# Patient Record
Sex: Male | Born: 1965 | Race: White | Hispanic: No | Marital: Married | Smoking: Never smoker
Health system: Southern US, Community
[De-identification: ages and names within clinical notes are randomized; demographics above are authoritative.]

## PROBLEM LIST (undated history)

## (undated) DIAGNOSIS — I10 Essential (primary) hypertension: Secondary | ICD-10-CM

## (undated) DIAGNOSIS — Z87442 Personal history of urinary calculi: Secondary | ICD-10-CM

## (undated) DIAGNOSIS — M7662 Achilles tendinitis, left leg: Secondary | ICD-10-CM

## (undated) DIAGNOSIS — M5137 Other intervertebral disc degeneration, lumbosacral region: Secondary | ICD-10-CM

## (undated) DIAGNOSIS — E785 Hyperlipidemia, unspecified: Secondary | ICD-10-CM

## (undated) HISTORY — PX: ROTATOR CUFF REPAIR: SHX139

## (undated) HISTORY — DX: Morbid (severe) obesity due to excess calories: E66.01

## (undated) HISTORY — DX: Essential (primary) hypertension: I10

## (undated) HISTORY — DX: Other intervertebral disc degeneration, lumbosacral region: M51.37

## (undated) HISTORY — DX: Hyperlipidemia, unspecified: E78.5

## (undated) HISTORY — DX: Achilles tendinitis, left leg: M76.62

## (undated) HISTORY — DX: Personal history of urinary calculi: Z87.442

## (undated) HISTORY — PX: TOE SURGERY: SHX1073

---

## 1999-02-14 ENCOUNTER — Encounter: Payer: Self-pay | Admitting: Emergency Medicine

## 1999-02-14 ENCOUNTER — Emergency Department (HOSPITAL_COMMUNITY): Admission: EM | Admit: 1999-02-14 | Discharge: 1999-02-14 | Payer: Self-pay | Admitting: Emergency Medicine

## 2007-02-03 ENCOUNTER — Emergency Department (HOSPITAL_COMMUNITY): Admission: EM | Admit: 2007-02-03 | Discharge: 2007-02-03 | Payer: Self-pay | Admitting: Emergency Medicine

## 2007-12-29 ENCOUNTER — Inpatient Hospital Stay (HOSPITAL_COMMUNITY): Admission: EM | Admit: 2007-12-29 | Discharge: 2008-01-12 | Payer: Self-pay | Admitting: Emergency Medicine

## 2007-12-30 ENCOUNTER — Encounter (INDEPENDENT_AMBULATORY_CARE_PROVIDER_SITE_OTHER): Payer: Self-pay | Admitting: General Surgery

## 2007-12-30 ENCOUNTER — Ambulatory Visit: Payer: Self-pay | Admitting: Internal Medicine

## 2008-01-12 ENCOUNTER — Ambulatory Visit: Payer: Self-pay | Admitting: Gastroenterology

## 2008-07-16 ENCOUNTER — Encounter: Payer: Self-pay | Admitting: Internal Medicine

## 2008-12-06 LAB — CONVERTED CEMR LAB: PSA: 1.2 ng/mL

## 2008-12-13 ENCOUNTER — Ambulatory Visit: Payer: Self-pay | Admitting: Internal Medicine

## 2008-12-13 DIAGNOSIS — E785 Hyperlipidemia, unspecified: Secondary | ICD-10-CM

## 2008-12-13 DIAGNOSIS — Z87442 Personal history of urinary calculi: Secondary | ICD-10-CM | POA: Insufficient documentation

## 2008-12-13 DIAGNOSIS — M51379 Other intervertebral disc degeneration, lumbosacral region without mention of lumbar back pain or lower extremity pain: Secondary | ICD-10-CM | POA: Insufficient documentation

## 2008-12-13 DIAGNOSIS — I10 Essential (primary) hypertension: Secondary | ICD-10-CM

## 2008-12-13 DIAGNOSIS — E782 Mixed hyperlipidemia: Secondary | ICD-10-CM | POA: Insufficient documentation

## 2008-12-13 DIAGNOSIS — M5137 Other intervertebral disc degeneration, lumbosacral region: Secondary | ICD-10-CM

## 2008-12-13 HISTORY — DX: Essential (primary) hypertension: I10

## 2008-12-13 HISTORY — DX: Other intervertebral disc degeneration, lumbosacral region: M51.37

## 2008-12-13 HISTORY — DX: Other intervertebral disc degeneration, lumbosacral region without mention of lumbar back pain or lower extremity pain: M51.379

## 2008-12-13 HISTORY — DX: Hyperlipidemia, unspecified: E78.5

## 2008-12-13 HISTORY — DX: Personal history of urinary calculi: Z87.442

## 2009-02-15 ENCOUNTER — Telehealth (INDEPENDENT_AMBULATORY_CARE_PROVIDER_SITE_OTHER): Payer: Self-pay | Admitting: *Deleted

## 2009-08-08 ENCOUNTER — Telehealth: Payer: Self-pay | Admitting: Internal Medicine

## 2010-09-01 ENCOUNTER — Ambulatory Visit: Payer: Self-pay | Admitting: Internal Medicine

## 2010-09-01 ENCOUNTER — Encounter: Payer: Self-pay | Admitting: Internal Medicine

## 2010-09-01 LAB — CONVERTED CEMR LAB
Albumin: 4.4 g/dL (ref 3.5–5.2)
Alkaline Phosphatase: 39 units/L (ref 39–117)
Basophils Absolute: 0.1 10*3/uL (ref 0.0–0.1)
Basophils Relative: 1 % (ref 0.0–3.0)
CO2: 29 meq/L (ref 19–32)
Calcium: 9.3 mg/dL (ref 8.4–10.5)
Chloride: 105 meq/L (ref 96–112)
Cholesterol, target level: 200 mg/dL
Cholesterol: 180 mg/dL (ref 0–200)
Eosinophils Absolute: 0.4 10*3/uL (ref 0.0–0.7)
Glucose, Bld: 86 mg/dL (ref 70–99)
HCT: 48.3 % (ref 39.0–52.0)
HDL: 32.8 mg/dL — ABNORMAL LOW (ref >39.00)
Hemoglobin: 16.7 g/dL (ref 13.0–17.0)
Leukocytes, UA: NEGATIVE
Lymphocytes Relative: 22.7 % (ref 12.0–46.0)
Lymphs Abs: 1.5 10*3/uL (ref 0.7–4.0)
MCHC: 34.5 g/dL (ref 30.0–36.0)
MCV: 90.3 fL (ref 78.0–100.0)
Neutro Abs: 4 10*3/uL (ref 1.4–7.7)
PSA: 1.46 ng/mL (ref 0.10–4.00)
Potassium: 5.2 meq/L — ABNORMAL HIGH (ref 3.5–5.1)
RBC: 5.35 M/uL (ref 4.22–5.81)
RDW: 12.6 % (ref 11.5–14.6)
Sodium: 141 meq/L (ref 135–145)
Specific Gravity, Urine: 1.025 (ref 1.000–1.030)
TSH: 1.36 microintl units/mL (ref 0.35–5.50)
Total Protein: 7.5 g/dL (ref 6.0–8.3)
Urine Glucose: NEGATIVE mg/dL
Urobilinogen, UA: 0.2 (ref 0.0–1.0)
VLDL: 31.6 mg/dL (ref 0.0–40.0)
pH: 7 (ref 5.0–8.0)

## 2010-10-02 ENCOUNTER — Telehealth: Payer: Self-pay | Admitting: Internal Medicine

## 2010-10-19 ENCOUNTER — Telehealth: Payer: Self-pay | Admitting: Internal Medicine

## 2010-12-05 NOTE — Assessment & Plan Note (Signed)
Summary: CPX/ WANTS TO DO LABS SAME DAY/ REFILLS / NWS  #   Vital Signs:  Patient profile:   45 year old male Height:      73 inches Weight:      290 pounds BMI:     38.40 O2 Sat:      93 % on Room air Temp:     97.8 degrees F oral Pulse rate:   64 / minute BP sitting:   120 / 78  Vitals Entered By: Corwin Levins MD (September 01, 2010 9:04 AM)  O2 Flow:  Room air  Preventive Care Screening  PSA:    Date:  12/06/2008    Results:  1.20  ng/mL   History of Present Illness: here for wellness; peak wt has been as much as 325 at home, but started walking 2.5 miles daily without missing for several months with good wt loss to current;  Pt denies CP, worsening sob, doe, wheezing, orthopnea, pnd, worsening LE edema, palps, dizziness or syncope  Pt denies new neuro symptoms such as headache, facial or extremity weakness  Pt denies polydipsia, polyuria.  Overall good compliance with meds, trying to follow low chol diet, wt and excercise as above,  Declines flu shot today.  Pt states good ability with ADL's, low fall risk, home safety reviewed and adequate, no significant change in hearing or vision.  Denies worsening depressive symptoms, suicidal ideation, or panic.  No new complaints   Lipid Management History:      Positive NCEP/ATP III risk factors include male age 56 years old or older and hypertension.  Negative NCEP/ATP III risk factors include non-tobacco-user status.    Preventive Screening-Counseling & Management      Drug Use:  no.    Problems Prior to Update: 1)  Preventive Health Care  (ICD-V70.0) 2)  Disc Disease, Lumbar  (ICD-722.52) 3)  Hyperlipidemia  (ICD-272.4) 4)  Nephrolithiasis, Hx of  (ICD-V13.01) 5)  Hypertension  (ICD-401.9)  Medications Prior to Update: 1)  Lisinopril 10 Mg Tabs (Lisinopril) .... Once Daily 2)  Adult Aspirin Ec Low Strength 81 Mg Tbec (Aspirin) .Marland Kitchen.. 1 By Mouth Once Daily 3)  Acyclovir 200 Mg Caps (Acyclovir) .Marland Kitchen.. 1 By Mouth 5 Times Per Day 4)   Valtrex 1 Gm Tabs (Valacyclovir Hcl) .Marland Kitchen.. 1 Gm By Mouth Two Times A Day As Needed  Current Medications (verified): 1)  Lisinopril 10 Mg Tabs (Lisinopril) .Marland Kitchen.. 1 By Mouth Once Daily 2)  Adult Aspirin Ec Low Strength 81 Mg Tbec (Aspirin) .Marland Kitchen.. 1 By Mouth Once Daily 3)  Valtrex 1 Gm Tabs (Valacyclovir Hcl) .Marland Kitchen.. 1 Gm By Mouth Two Times A Day As Needed  Allergies (verified): No Known Drug Allergies  Past History:  Past Medical History: Last updated: 12/13/2008 Hypertension Nephrolithiasis, hx of Hyperlipidemia 2/09 episode abd wall cellulitis and renal insufficiency recurrent ear problems/eustachian valve dysfunction hx of lumbar disc disease  recurrent fever blisters  Past Surgical History: Last updated: 12/13/2008 Rotator cuff repair - dr Priscille Kluver   Family History: Last updated: 12/13/2008 mother with DJD father with elev chol, heart disease, HTN, stroke, DM grandmother with breast cancer grandfather with "pre-leukemia" blood disorder, HTN mutliple others with HTN  Social History: Last updated: 09/01/2010 Married 1 child work - Production designer, 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         all otherwise  negative per pt -  but would like name for urology for vasectomy so he can self refer  Physical Exam  General:  alert and overweight-appearing.   Head:  Normocephalic and atraumatic without obvious abnormalities. No apparent alopecia or balding. Eyes:  No corneal or conjunctival inflammation noted. EOMI. Perrla. Ears:  External ear exam shows no significant lesions or deformities.  Otoscopic examination reveals clear canals, tympanic membranes are intact bilaterally without bulging, retraction, inflammation or discharge. Hearing is grossly normal bilaterally. Nose:  External nasal examination shows no deformity or inflammation. Nasal mucosa are pink and moist without lesions or exudates. Mouth:  Oral mucosa and oropharynx without lesions or exudates.  Teeth in good repair. Neck:  No deformities, masses, or tenderness noted. Lungs:  Normal respiratory effort, chest expands symmetrically. Lungs are clear to auscultation, no crackles or wheezes. Heart:  Normal rate and regular rhythm. S1 and S2 normal without gallop, murmur, click, rub or other extra sounds. Abdomen:  Bowel sounds positive,abdomen soft and non-tender without masses, organomegaly or hernias noted. Msk:  no joint tenderness and no joint swelling.   Extremities:  no edema, no ulcers  Neurologic:  cranial nerves II-XII intact and strength normal in all extremities.   Skin:  color normal and no rashes.   Psych:  not depressed appearing and slightly anxious.     Impression & Recommendations:  Problem # 1:  Preventive Health Care (ICD-V70.0) Overall doing well, age appropriate education and counseling updated, referral for preventive services and immunizations addressed, dietary counseling and smoking status adressed , most recent labs reviewed, ecg reviewed I have personally reviewed and have noted 1.The patient's medical and social history 2.Their use of alcohol, tobacco or illicit drugs 3.Their current medications and  supplements 4. Functional ability including ADL's, fall risk, home safety risk, hearing & visual impairment 5.Diet and physical activities 6.Evidence for depression or mood disorders The patients weight, height, BMI  have been recorded in the chart I have made referrals, counseling and provided education to the patient based review of the above   Orders: EKG w/ Interpretation (93000) TLB-BMP (Basic Metabolic Panel-BMET) (80048-METABOL) TLB-CBC Platelet - w/Differential (85025-CBCD) TLB-Hepatic/Liver Function Pnl (80076-HEPATIC) TLB-Lipid Panel (80061-LIPID) TLB-PSA (Prostate Specific Antigen) (84153-PSA) TLB-TSH (Thyroid Stimulating Hormone) (84443-TSH) TLB-Udip ONLY (81003-UDIP)  Problem # 2:  HYPERTENSION (ICD-401.9)  His updated medication list for this problem includes:    Lisinopril 10 Mg Tabs (Lisinopril) .Marland Kitchen... 1 by mouth once daily  BP today: 120/78 Prior BP: 142/90 (12/13/2008) stable overall by hx and exam, ok to continue meds/tx as is   Complete Medication List: 1)  Lisinopril 10 Mg Tabs (Lisinopril) .Marland Kitchen.. 1 by mouth once daily 2)  Adult Aspirin Ec Low Strength 81 Mg Tbec (Aspirin) .Marland Kitchen.. 1 by mouth once daily 3)  Valtrex 1 Gm Tabs (Valacyclovir hcl) .Marland Kitchen.. 1 gm by mouth two times a day as needed  Lipid Assessment/Plan:      Based on NCEP/ATP III, the patient's risk factor category is "2 or more risk factors and  a calculated 10 year CAD risk of > 20%".  The patient's lipid goals are as follows: Total cholesterol goal is 200; LDL cholesterol goal is 130; HDL cholesterol goal is 40; Triglyceride goal is 150.     Patient Instructions: 1)  consider Dr Dorann Ou urology for the vasectomy 2)  Please go to the Lab in the basement for your blood and/or urine tests today 3)  Please call the number on the [facility] Card for results of your testing 4)  Continue all previous medications as before this visit  5)  Please schedule a follow-up appointment in 1 year, or sooner if  needed Prescriptions: VALTREX 1 GM TABS (VALACYCLOVIR HCL) 1 gm by mouth two times a day as needed  #60 x 5   Entered and Authorized by:   Corwin Levins MD   Signed by:   Corwin Levins MD on 09/01/2010   Method used:   Print then Give to Patient   RxID:   [id number] LISINOPRIL 10 MG TABS (LISINOPRIL) 1 by mouth once daily  #90 x 3   Entered and Authorized by:   Corwin Levins MD   Signed by:   Corwin Levins MD on 09/01/2010   Method used:   Electronically to        [facility]* (mail-order)             , [place]         Ph: [phone]       Fax: [phone]   RxID:   [id number]    Orders Added: 1)  EKG w/ Interpretation [93000] 2)  TLB-BMP (Basic Metabolic Panel-BMET) [80048-METABOL] 3)  TLB-CBC Platelet - w/Differential [85025-CBCD] 4)  TLB-Hepatic/Liver Function Pnl [80076-HEPATIC] 5)  TLB-Lipid Panel [80061-LIPID] 6)  TLB-PSA (Prostate Specific Antigen) [84153-PSA] 7)  TLB-TSH (Thyroid Stimulating Hormone) [84443-TSH] 8)  TLB-Udip ONLY [81003-UDIP] 9)  Est. Patient 40-64 years [62831]

## 2010-12-05 NOTE — Progress Notes (Signed)
Summary: REFILL   Phone Note Refill Request Call back at Work Phone [phone] Call back at [phone]   Refills Requested: Medication #1:  LISINOPRIL 10 MG TABS 1 by mouth once daily TO GO TO [facility] - Pt req a call when complete  Initial call taken by: Lamar Sprinkles, CMA,  October 02, 2010 5:29 PM  Follow-up for Phone Call        Pt informed  Follow-up by: Lamar Sprinkles, CMA,  October 02, 2010 6:41 PM    Prescriptions: LISINOPRIL 10 MG TABS (LISINOPRIL) 1 by mouth once daily  #90 x 3   Entered by:   Lamar Sprinkles, CMA   Authorized by:   Corwin Levins MD   Signed by:   Lamar Sprinkles, CMA on 10/02/2010   Method used:   Faxed to ...       [facility]* (mail-order)             , [place]         Ph: [phone]       Fax: [phone]   RxID:   [id number]

## 2010-12-07 NOTE — Progress Notes (Signed)
Summary: Back pain  Phone Note Call from Patient Call back at Home Phone [phone]   Caller: Patient----[phone] Summary of Call: Pt requesting muscle relaxers for back pain. Pt has had back pain for a couple days. Pt has no known injury. Initial call taken by: Verdell Face,  October 19, 2010 4:11 PM  Follow-up for Phone Call        done per emr this time only, to f/u OV for persistent pain, fever, wt loss, bowel or bladder change, or extremity symtpom such as pain/weakness/numbness Follow-up by: Corwin Levins MD,  October 19, 2010 8:52 PM  Additional Follow-up for Phone Call Additional follow up Details #1::        Pt advised in detail of above Additional Follow-up by: Margaret Pyle, CMA,  October 20, 2010 9:48 AM    New/Updated Medications: FLEXERIL 5 MG TABS (CYCLOBENZAPRINE HCL) 1 by mouth three times a day as needed pain Prescriptions: FLEXERIL 5 MG TABS (CYCLOBENZAPRINE HCL) 1 by mouth three times a day as needed pain  #40 x 0   Entered and Authorized by:   Corwin Levins MD   Signed by:   Corwin Levins MD on 10/19/2010   Method used:   Electronically to        [facility]  [address] [phone]* (retail)       [address] [place], [place]  [address]       Ph: [phone] or [phone]       Fax: [phone]   RxID:   [id number]

## 2011-03-20 NOTE — H&P (Signed)
NAME:  Benjamin Paul, Benjamin Paul NO.:  [id number]   MEDICAL RECORD NO.:  [id number]          PATIENT TYPE:  EMS   LOCATION:  ED                           FACILITY:  [facility]   PHYSICIAN:  Angelia Mould. Derrell Lolling, M.D.DATE OF BIRTH:  1966-09-16   DATE OF ADMISSION:  12/29/2007  DATE OF DISCHARGE:                              HISTORY & PHYSICAL   CHIEF COMPLAINT:  Abdominal pain and shingles.   PRESENT ILLNESS:  This is a pleasant but acutely ill 45 year old white  man who states that he has been sick for 7 days.  Monday, 7 days ago, he  noted the onset of fever and chills with temperature up to 102.  He has  been nauseated since that time and has not vomited.  He is continued to  have daily bowel movements.  His bowel movements have gotten a little  softer but he has not seen any blood in them.   His symptoms continued all week.  He finally went to [place] at [facility]  on Thursday, February 19.  He was told that he might have shingles and  was given a prescription for an antiviral, which he cannot remember.  He  continues to have abdominal pain, more so on the right lower quadrant.  He has noted some pain in his upper thigh on the right side.  His last  bowel movement was today at 10 o'clock a.m. and it was soft with no  blood in it.   He was seen by Dr. Rubin Payor in the emergency room.  Dr. Rubin Payor noted  his abdominal pain and thought that he might have an incarcerated  hernia.  Abdominal x-rays have been performed which show a question of  atelectasis of the left base with a possible calcified granuloma.  Bowel  gas pattern suggested a partial proximal small-bowel obstruction  although there is plenty of gas in the right colon and some gas in the  transverse colon and splenic flexure as well, so it is not clear whether  this is an obstruction or ileus.  The patient was found to have a white  blood cell count of 24,000, hemoglobin of 13.9, BUN of 35, a creatinine  2.76, a  sodium of 131 and potassium of 3.5 and glucose of 127.  This was  all felt to be acute.  I was called to see the patient.   PAST HISTORY:  The patient has had surgery on his left shoulder but has  otherwise been healthy.  He has borderline hypertension.   CURRENT MEDICATIONS:  Lisinopril 10 mg a day.   DRUG ALLERGIES:  None known.   SOCIAL HISTORY:  The patient is married, has one child.  They live in  [place].  He a Production designer, theatre/television/film for [employer].  Denies tobacco.  Uses  alcohol rarely.   FAMILY HISTORY:  Mother and father are with him.  Mother living and  well.  Father living, has had a myocardial infarction, has diabetes and  has had a melanoma   REVIEW OF SYSTEMS:  A 15-system review of systems is performed and is  noncontributory except as described above.   PHYSICAL EXAM:  A pleasant, overweight middle-aged white man in mild to  moderate distress.  Temperature 97.5.  Pulse initially 132, now down to 107.  Respirations  24 , not labored.  Blood pressure 114/59.  Eyes:  Sclerae are clear.  Extraocular movements intact.  Ears, mouth,  throat, nose, lips, tongue and oropharynx are without gross lesions.  NECK:  Supple, nontender.  No mass.  No jugular distention.  No  tenderness.  HEART:  A regular tachycardia.  No murmurs heard.  LUNGS:  Clear to auscultation.  No chest wall tenderness.  ABDOMEN:  Somewhat obese.  He is tender in the right lower quadrant with  guarding there.  There is erytheme of the right lower abdominal area and  right flank. I do not feel a mass or hernia.  He is soft in the upper  abdomen and is soft on the left side.  He is also tender in the  suprapubic area as well as the right lower quadrant.  I do not feel a  mass.  I do not feel an inguinal hernia.  I do not feel a femoral  hernia.  He is tender down there, however.  GENITOURINARY:  Penis, scrotum and testes feel fine.  I feel both  testicles in the scrotum.  RECTAL:  External exam shows no  sign of infection or tenderness or  inflammation.  I did not do a digital exam.  EXTREMITIES:  In the right thigh proximally there is some erythema and  induration and tenderness which extends transversely below the inguinal  crease.  Is also a tender area on the anterior thigh in the proximal one-  third which is separate from this area.  Visually these areas suggest  soft tissue infection and cellulitis.  NEUROLOGIC:  No gross motor or sensory deficits.   DATA REVIEWED:  Chest x-ray, abdominal films and lab work as described  above.   ASSESSMENT:  1. Abdominal pain.  Right lower quadrant and suprapubic area suggests      possibilities of appendicitis or diverticulitis or occult      incarcerated internal hernia or soft tissue infection.  2. Partial small-bowel obstruction suggested by x-rays.  This may      simply be an ileus reactive to whatever his intra-abdominal or      infectious process is.  3. Cellulitis of the right thigh and right flank, etiology unclear.  4. Prerenal azotemia and acute dehydration.   PLAN:  1. The patient has already received 2 L of saline and we will continue      volume resuscitation.  2. We will insert a Foley catheter and get a urinalysis, PT/PTT and      liver function tests.  3. We will start him on IV vancomycin and Primaxin.  4. Since the exact etiology of his acute abdominal pain is not clear,      I think we will need to proceed with a CT scan as an emergency      investigation.  I think that he can tolerate the oral contrast but      we cannot give him IV contrast because of his renal insufficiency.      I have this with the patient and his family and they are in      agreement.      Angelia Mould. Derrell Lolling, M.D.  Electronically Signed     HMI/MEDQ  D:  12/29/2007  T:  12/30/2007  Job:  [id number]

## 2011-03-20 NOTE — Consult Note (Signed)
NAME:  Benjamin Paul, Benjamin Paul NO.:  [id number]   MEDICAL RECORD NO.:  [id number]          PATIENT TYPE:  INP   LOCATION:  1237                         FACILITY:  [facility]   PHYSICIAN:  Dyke Maes, M.D.DATE OF BIRTH:  [birth year]   DATE OF CONSULTATION:  12/30/2007  DATE OF DISCHARGE:                                 CONSULTATION   REFERRING PHYSICIAN:  Angelia Mould. Derrell Lolling, M.D.   REASON FOR CONSULTATION:  Acute renal failure.   HISTORY OF PRESENT ILLNESS:  This is a 45 year old white male with a  history of fevers, chills, abdominal pain and right leg pain for the  last 5-7 days.  He presented in the emergency room last night with a  septic looking picture.  Because there were concerns of an abdominal  wall infection he went to the OR today and had debridement of his right  abdominal wall and right thigh.  The patient has a history of  hypertension but no known history of renal disease.  Serum creatinine on  admission was 2.7 though today it has decreased slightly to 2.4.  Urine  output has been 1 liter today.  Of note is the fact that he was on  lisinopril prior to admission and he had been taking Advil four every 4  hours for the last 2 days prior to admission.  His p.o. intake as well  had been poor.   PAST MEDICAL HISTORY:  1. Significant for hypertension.  2. History of kidney stone.  3. History of surgery to the left shoulder.   ALLERGIES:  None.   MEDICATIONS PRIOR TO ADMISSION:  Included only lisinopril 10 mg a day  and Advil as noted above.   SOCIAL HISTORY:  Nonsmoker and he rarely drinks alcohol.  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   FAMILY HISTORY:  Mother is alive and healthy.  Father has coronary  artery disease, diabetes and history of melanoma.   REVIEW OF SYSTEMS:  The patient is currently postoperatively.  He is  awake and alert.  His pain is controlled.  His appetite has been poor.  No shortness of  breath.  No chest pains.  No dysuria.  Rest of review of  systems is unremarkable.   PHYSICAL EXAMINATION:  VITAL SIGNS:  Blood pressure is 94/53, pulse 118,  temperature 97.3.  GENERAL:  A 45 year old white male in moderate distress as he is postop.  LUNGS:  Reveal a few crackles in the bases.  HEART:  Tachycardic but regular.  No murmur, rub or gallop.  ABDOMEN:  Positive bowel sounds.  The right abdominal wall has a  bandage.  Left abdominal wall is nontender and positive bowel sounds.  EXTREMITIES:  Reveals no edema.  There is a bandage over his right upper  thigh.  Pulses are 2/4 and equal throughout.  NEUROLOGIC:  Cranial nerves intact.  Motor and sensory intact.  He is  awake and alert, oriented x3.   LABORATORY:  Sodium 124, creatinine 3.6, bicarb 15, BUN 42, creatinine  2.4, white count  26.1, hemoglobin 12.6, platelet count 284,000.  Urinalysis showed 3-6 WBCs with some hyaline casts and amorphous urates,  30 mg of protein on dipstick.   IMPRESSION:  1. Acute renal failure.  This is most likely multifactorial origin and      secondary to low blood pressure, nonsteroidal use, infection,      volume depletion all in the face of an ACE inhibitor.  2. Metabolic acidosis.  3. Abdominal wall cellulitis plus or minus myositis.  4. History of hypertension.  5. Hyponatremia.   RECOMMENDATIONS:  1. I have written IV fluids to support his blood pressure.  2. Will give him 1 ampule of IV bicarb now.  One reason is bicarb is      probably changed because he received a fair amount of normal saline      to the tune of 6 liters yesterday.  We will closely follow his      bicarb and give him bicarb as needed.  A lactate level has been      ordered and is pending.  We will check a daily serum creatinine      level and hold lisinopril.  No nonsteroidals for now as renal      function should get better as his overall condition improves.   Thank you very much for the consult.  I will  follow the patient with  you.           ______________________________  Dyke Maes, M.D.     MTM/MEDQ  D:  12/30/2007  T:  12/31/2007  Job:  [id number]

## 2011-03-20 NOTE — Op Note (Signed)
NAME:  JAKEN, FREGIA NO.:  [id number]   MEDICAL RECORD NO.:  [id number]          PATIENT TYPE:  INP   LOCATION:  1237                         FACILITY:  [facility]   PHYSICIAN:  Angelia Mould. Derrell Lolling, M.D.DATE OF BIRTH:  01/18/1966   DATE OF PROCEDURE:  12/30/2007  DATE OF DISCHARGE:                               OPERATIVE REPORT   PREOPERATIVE DIAGNOSIS:  Soft tissue infection right abdominal wall,  right flank and right thigh.   POSTOPERATIVE DIAGNOSIS:  Soft tissue infection right abdominal wall,  right flank and right thigh, suspect streptococcal.   OPERATION PERFORMED:  1. Wound exploration, right thigh and right abdominal wall and right      flank.  2. Debridement of skin, subcutaneous tissue and muscle of right      abdominal wall.  3. Debridement of skin, subcutaneous tissue and muscle of right thigh.   SURGEON:  Claud Kelp, MD   FIRST ASSISTANT:  Anselm Pancoast. Zachery Dakins, MD   OPERATIVE INDICATIONS:  This is a 45 year old white man, generally  healthy, who weighs about 300 pounds.  He apparently has had some fever  and chills for almost a week.  He was seen at one of the primary care  offices about 4 days ago with pain in his right thigh just below the  inguinal crease and was told he had shingles and was put on antiviral  medication.  He did not get any better.  He presented to the [facility]  Emergency Room last night with tachycardia and dehydration, acute renal  failure, profound leukocytosis and on exam was found to have an  erythematous cellulitis of the right lower quadrant of the abdominal  wall, the right flank and a patchy cellulitis in the right superior  anterior thigh.  These areas were not fluctuant, were not crepitant and  had no bullae.  A CT scan just showed soft tissue edema.  He was  admitted and placed on IV vancomycin and IV Primaxin.  Overnight, his  urine output picked up, but his BUN and creatinine did not improve and  he  continued to have leukocytosis and tachycardia.  On exam, the  erythema was spreading in his right flank.  He was brought to operating  room for wound exploration.   OPERATIVE FINDINGS:  I made several incisions in the right flank, right  lower quadrant of the abdominal wall and right anterior thigh.  In each  of these cases, the skin and subcutaneous fat looked perfectly healthy,  but when we got down to the plane between the subcutaneous fat and the  investing muscle fascia, I ran into several pockets of slightly necrotic  fat and white purulent fluid that had no odor whatsoever.  There were  not really obvious abscesses, but it was obviously a purulent fluid.  I  made incision in the muscle fascia in the thigh and the right flank and  found that the muscle was healthy and was viable and had good twitches.  There was no odor and this did not look like a fasciitis; this looked  like a gram-positive  infection.   OPERATIVE TECHNIQUE:  Following induction of general endotracheal  anesthesia, the patient was identified as the correct patient, correct  procedure and correct site.  He was placed in a modified left lateral  decubitus position and that gave good exposure to the right side of the  abdomen, the right flank and the right back, as well as the right  anterior thigh and inguinal area.  I made a long oblique incision in the  right flank and took the dissection down to the muscle fascia with  findings as described above.  Wound cultures and gram stains were  obtained.  I incised the external oblique muscle fascia and found that  the muscle was healthy underneath.  I found that I could bluntly dissect  inferiorly in the plane where the purulence was and then I made another  counterincision in the right lower quadrant of the abdomen extending  almost all the way to the suprapubic area, connecting the 2 incisions.  I made a transverse oblique incision in the right thigh about 4 cm below   the inguinal crease and found the same findings.  I made another  incision more inferiorly in the thigh where there was a separate pocket  and opened all of this up and broke up all the tissue planes until all  of the wounds communicated with each other.  I did this until Dr.  Zachery Dakins and I were both satisfied that we had dissected all of the  infected tissue out.  There was some ischemic-looking fat which was  debrided sharply.  We also debrided a little bit of muscle and skin, but  mostly we were able to preserve the skin and subcutaneous fat and it  appeared viable at the end.  We used a pulsatile irrigator to wash out  all the wounds.  Hemostasis was very good and achieved with  electrocautery.  The wounds were all packed with saline-moistened Kerlix  and about 6 or 7 Kerlix were required.  Clean bandages were placed and  the patient taken to the intensive care unit in stable, but critical  condition.  Estimated blood loss was about 300 mL or less.   COMPLICATIONS:  None.   COUNTS:  Sponge, needle and instrument counts were correct.      Angelia Mould. Derrell Lolling, M.D.  Electronically Signed     HMI/MEDQ  D:  12/30/2007  T:  12/31/2007  Job:  [id number]   cc:   Cliffton Asters, M.D.  Fax: [phone]   Hollice Espy, M.D.

## 2011-03-20 NOTE — Op Note (Signed)
NAME:  Benjamin Paul, Benjamin Paul NO.:  [id number]   MEDICAL RECORD NO.:  [id number]          PATIENT TYPE:  INP   LOCATION:  1237                         FACILITY:  [facility]   PHYSICIAN:  Angelia Mould. Derrell Lolling, M.D.DATE OF BIRTH:  11-28-1965   DATE OF PROCEDURE:  12/31/2007  DATE OF DISCHARGE:                               OPERATIVE REPORT   PREOPERATIVE DIAGNOSES:  Suppurative soft tissue infection and  cellulitis of the right back, right abdominal wall and right thigh.   POSTOPERATIVE DIAGNOSES:  Suppurative soft tissue infection and  cellulitis of the right back, right abdominal wall and right thigh.   OPERATION PERFORMED:  1. Planned return to the operating room for dressing change.  2. Debridement of the subcutaneous tissue.   SURGEON:  Dr. Claud Kelp.   OPERATIVE INDICATIONS:  This is a 45 year old white man generally in  good health who presented to this hospital on the evening of December 29, 2007 with a 1-week history of fever, chills, redness and pain in his  right lower quadrant abdominal wall, right flank and right thigh. He was  admitted and thought to have simple cellulitis although he was found to  a white blood cell count of 25,000 and renal insufficiency.  He was  aggressively hydrated placed on broad spectrum antibiotics including  vancomycin and Primaxin and 8 hours later it appeared that his  cellulitis was worse and was spreading.  He is brought to operating room  yesterday afternoon at which time I made several incisions in the right  flank, right lower quadrant and right thigh and found purulent fluid in  the space between the subcutaneous fat and the muscle fascia. The fascia  was intact as was the muscle.  There was a little bit of necrotic fat.  We debrided all devitalized adipose tissue and took cultures and washed  all of this out.  He is growing Gram positive cocci in pairs and chains  and has been stable over the past 24 hours. Although he  still looks  somewhat toxic, he has remained hemodynamically stable and has not  required any pressor support.  I brought him back to the operating room  today to change his dressing and to make sure that the infection was  under control and to be sure there was not any further debridement  required to control this infection.   OPERATIVE TECHNIQUE:  Following the induction of general endotracheal  anesthesia, the patient was placed in a left lateral decubitus position  which exposed the right side of the abdomen, the right back and the  right inguinal area and the right anterior thigh. After proper padding  and cushioning, the dressings were completely removed.  I removed all  the Kerlix gauze out.  There were seven complete Kerlix rolls in his  wounds because of the large nature of the wounds.  The wounds looked  pretty good.  We then prepped and draped the area.   I then explored each of the four wounds and found a little bit of the  devitalized fat in scattered areas but mostly  the tissues were viable.  There was no odor.  There was no more purulence anywhere.  After I  debrided a little bit of fat from all the wounds and was certain that  there was no more infectious process only finding some edema here and  there, I then used the pulsatile irrigator to wash all the wounds out  thoroughly with several liters of saline.  I removed all of this fluid.  There was minimal bleeding, a small amount of bleeding was controlled  with cautery.  The tissues all were soft and looked viable.  I packed  all of these wounds open with Kerlix. At this time it took six complete  Kerlix to pack all of the wounds.  They were then covered with absorbent  bandages. The patient tolerated  the procedure well and was returned to the ICU in stable condition.  Estimated blood loss was about 50 mL or probably less.  Complications  none. Sponge count was correct.  Instrument count was correct.  No  sutures or  needles were used.      Angelia Mould. Derrell Lolling, M.D.  Electronically Signed     HMI/MEDQ  D:  12/31/2007  T:  01/01/2008  Job:  [id number]

## 2011-03-20 NOTE — Consult Note (Signed)
NAME:  Benjamin Paul, Benjamin Paul NO.:  [id number]   MEDICAL RECORD NO.:  [id number]          PATIENT TYPE:  INP   LOCATION:  1314                         [facility]ACILITY[facility]   PHYSICIAN:  Hollice Espy, M.D.DATE OF BIRTH:  10-24-1966   DATE OF CONSULTATION:  12/29/2007  DATE OF DISCHARGE:                                 CONSULTATION   PRIMARY CARE PHYSICIAN:  Dr. Artis Flock.   He recently followed at [facility] at [place] and saw a nurse practitioner  there.  His attending physician for this case is Dr. Meriam Sprague.   REASON FOR MEDICAL CONSULTATION:  Medical assistance with cellulitis and  renal failure.   HISTORY OF PRESENT ILLNESS:  The patient is a 45 year old white male,  past medical history of obesity and hypertension, who has previously  been in good health and is on lisinopril.  He has no previous history  that he knows of renal insufficiency and no other medical issues.  He  says he has been feeling fine, and then about 4 days ago he started  having problems with some mild left thigh pain, but his biggest issue  was some fevers, chills and generalized weakness.  The symptoms  persisted, and then 3 days ago he started having complaints of some  right upper and lower quadrant abdominal pain, as well as pain again on  the right side of his groin.  He was concerned, and he tried to call his  PCP, Dr. Artis Flock, but was unable to get appointment to be seen right away,  so he was able to follow up with his wife's PCP at the [facility] at  [place].  The patient was seen initially.  He was noted that point to  start developing a rash on the right side of his groin, as well as on  his right thigh and part of his abdomen.  There was concern that perhaps  this was shingles, and he was started on valacyclovir.  Symptoms  persisted.  The pain began to get severely worse.  He started having  nausea, vomiting, unable to keep anything down, and he came into the  emergency room today for  further evaluation.  In the emergency room, the  patient was noted to have a white count of 24, also of concern with his  renal function which noted a BUN of 35 and a creatinine 2.76.   PHYSICAL EXAM:  He was noted to have marked erythema and induration of  the right lower quadrant abdominal wall, deep in his right groin, as  well as  the anterior lateral aspect of his right thigh, as was  concerning for a severe cellulitis.  An abdominal CT was done which was  essentially unremarkable, although the abdominal wall __________ and  more of the internal organs were noted, appeared to be stable.  The  patient had liver function tests done, which noted some minimal  elevation of his transaminases and mild elevation of total bilirubin of  up to 1.7, evenly distributed.  The patient was seen by surgery and  started on IV fluids, as well  as IV Primaxin and vancomycin, and  medication for pain.  Currently, he states he is feeling a little bit  better.  When he does not move, he is not having severe abdominal pain  in the right lower quadrant area.  He otherwise denies any headaches,  vision changes, dysphagia, chest pain, palpitations, shortness of  breath, wheezing, coughing.  He has abdominal pain as above, and he has  barely had any urine output.  He has had no problems with constipation,  diarrhea, no focal extremity numbness, weakness, pain, other than  described above.   REVIEW OF SYSTEMS:  Otherwise negative.   PAST MEDICAL HISTORY:  His past medical history includes obesity and  hypertension.   MEDICATIONS:  He is on lisinopril, recently started on acyclovir.   ALLERGIES:  HE HAS NO KNOWN DRUG ALLERGIES.   SOCIAL HISTORY:  No tobacco, alcohol or drug use.  His extensive for  including CAD, cancer, diabetes mellitus.   PHYSICAL EXAM:  VITAL SIGNS:  The patient's vitals,  the first set of  vitals on the floor:  Temperature 99.2, heart rate 119, blood pressure  102/64, respirations  23, O2 sat 95% on room air.  GENERAL:  He is alert and oriented x3.  No prior distress, unless he  moves, then he has pain in that of right lower quadrant.  HEENT:  Normocephalic, atraumatic, mucous membranes are dry.  He has no  carotid bruits.  HEART:  Regular rate and rhythm, S1 and S2.  LUNGS:  Decreased breath sounds throughout, secondary to body habitus.  ABDOMEN:  Soft, non-distended, hypoactive bowel sounds in the right  lower quadrant, it is indurated with erythema in that area, grossly  along the process of his stretch marks and the anterior aspect of the  abdominal wall.  He also has some evidence of erythema in the right  groin folds as well as the right anterior aspect of his thigh, with some  lateral distention.   LAB WORK:  UA is essentially unremarkable, noting some moderate of  bilirubin, trace ketones, 30 protein, 4 of urobilinogen.  Liver function  test and a total bili 1.7, a direct of 0.8, with AST of 66, ALT is 78.  Albumin is 2.3.  Coags unremarkable.  White count 24.5, H&H 13.9 and 40,  MCV 87, platelet count 241 with a 85% shift, sodium 131, potassium 3.5,  chloride 98, bicarb 18, BUN 35, creatinine 2.70, glucose 127.   ASSESSMENT/PLAN:  1. Insidious onset cellulitis right lower quadrant, right thigh and      right sided groin.  Cause is not immediately clear, as the patient      is not diabetic.  There is no evidence of any type of trauma.  This      could be MRSA.  For right now will agree with Primaxin and Vanco.      Surgery is going to continue follow and possibly take this patient      for exploratory laparotomy, if things do not improve.  2. Acute renal failure, a question the cause of this may be pre-renal      dehydration.  He has no purported previous history of renal      insufficiency and this could be giving him dehydration from      secondary cellulitis.  Aggressively hydrate and follow his numbers.  3. Increase liver function tests, mild, this  could be more of a stress      response.  Will continue to  follow his      numbers.  4. Hypertension, which is stable.  5. Obesity.      Hollice Espy, M.D.  Electronically Signed     SKK/MEDQ  D:  12/29/2007  T:  12/29/2007  Job:  [id number]   cc:   Quita Skye. Artis Flock, M.D.  Fax: [phone]   [facility]. Derrell Lolling, M.D.  [address], [address]  [place]  [place] [address]

## 2011-03-20 NOTE — Op Note (Signed)
NAME:  Benjamin Paul, UTTER NO.:  [id number]   MEDICAL RECORD NO.:  [id number]          PATIENT TYPE:  INP   LOCATION:  1237                         FACILITY:  [facility]   PHYSICIAN:  Angelia Mould. Derrell Lolling, M.D.DATE OF BIRTH:  [birth year]   DATE OF PROCEDURE:  01/02/2008  DATE OF DISCHARGE:                               OPERATIVE REPORT   PREOPERATIVE DIAGNOSIS:  Cellulitis and suppurative soft tissue  infection of abdominal wall, right back and right thigh.   POSTOPERATIVE DIAGNOSIS:  Cellulitis and suppurative soft tissue  infection of abdominal wall, right back and right thigh.   OPERATION PERFORMED:  1. Planned return to operating room for dressing change.  2. Debridement of subcutaneous tissue.   SURGEON:  Claud Kelp, MD   OPERATIVE INDICATIONS:  This is a 45 year old white man who presented to  emergency room on December 29, 2007 with an acute illness and cellulitis  of his back, right abdominal wall and right thighs.  He was in acute  renal failure with white blood cell count of 25,000.  He was started on  antibiotics and fluid-resuscitated and the following morning, his  cellulitis was progressing and a CT scan showed suggestion of fluid  collections.  He was to brought to the operating room on February 24 and  underwent incision, drainage and debridement of what turned out to be a  group A streptococcal cellulitis and suppurative infection which was  fairly extensively involving the right flank, right lower quadrant of  the abdominal wall and right upper thigh.  He was brought to the  operating room the next day for dressing change and the wound was  looking cleaner and we debrided some subcutaneous tissue.  He is brought  back to the operating room today for a planned return to the operating  room for dressing change and debridement as necessary.  His condition  has been improving with intravenous antibiotics, attention to nutrition  and metabolism.   OPERATIVE FINDINGS:  The wounds were cleaner.  There was still some  devitalized fat, but not much.  This was debrided sharply.  Most of the  tissues were beginning to show a some sign of hypervascularity, although  no granulation tissue yet.  There was no more purulence.  There was no  odor.  The muscle tissues were all soft.   OPERATIVE TECHNIQUE:  Following the induction of general endotracheal  anesthesia, the patient was placed in a left lateral decubitus position,  which exposed the right back, the right abdominal wall and the right  anterior thigh.  All of the packing was removed.  The area was prepped  and draped in a sterile fashion.  The patient was identified as the  correct patient and correct procedure.   We explored all 4 of the wounds.  We debrided some devitalized fat when  we encountered it, but about 98% of the wound was clean.  We used  pulsatile lavage and debrided and irrigated all the wounds out very  thoroughly.  We packed the wounds with saline-moistened Kerlix, placed  absorbent bandages over this and concluded  the case.  The patient  tolerated the procedure well and taken to the recovery room in stable  condition.  Estimated blood loss was about 30-40 mL.   COMPLICATIONS:  None.   SPONGES AND INSTRUMENT COUNTS:  Correct.      Angelia Mould. Derrell Lolling, M.D.  Electronically Signed     HMI/MEDQ  D:  01/02/2008  T:  01/04/2008  Job:  [id number]

## 2011-03-23 NOTE — Discharge Summary (Signed)
NAME:  Benjamin Paul, Benjamin Paul NO.:  [id number]   MEDICAL RECORD NO.:  [id number]          PATIENT TYPE:  INP   LOCATION:  1519                         FACILITY:  [facility]   PHYSICIAN:  Angelia Mould. Derrell Lolling, M.D.DATE OF BIRTH:  16-May-1966   DATE OF ADMISSION:  12/29/2007  DATE OF DISCHARGE:  01/12/2008                               DISCHARGE SUMMARY   FINAL DIAGNOSES:  1. 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  2. nonoliguric acute renal failure, resolved.  3. Severe colonic ileus, resolved.  4. Borderline hypertension.  5. Obesity.   OPERATIONS PERFORMED:  1. Incision, irregation and debridement of skin and subcutaneous      tissue and muscle of the right flank and right thigh date December 30, 2007.  2. Planned return to OR for dressing change, debridement of skin and      subcutaneous tissue date December 31, 2007.  3. Planned return to OR for dressing change and debridement of      subcutaneous tissue date January 02, 2008.   HISTORY:  This is a 45 year old white man who presented to the [facility] Emergency Room with a 7-day history of fever and chills up to 102.  He complained of nausea but no vomiting.  He had been seen as an  outpatient, and initially was treated for shingles because of pain in  his right thigh.  He denied any trauma or bite or sting.  He complained  of pain in his right thigh and right flank.   In the emergency room he was noted have abdominal pain and what appeared  to be cellulitis of the right flank, right lower quadrant and right  thigh.  X-rays had been performed and suggested the possibility of a  partial small-bowel obstruction although there was lots of gas in the  right colon and transverse colon.  He was found a white blood cell count  24,000, a BUN of 35 and a creatinine 2.76, which was thought to be  acute.  I was called to  see the patient and he was admitted for further  evaluation and management.   ADMISSION EXAM:  Revealed alert but toxic-appearing white man who was  very large, stated weight 300 pounds.  LUNGS:  Clear to auscultation.  ABDOMEN:  Somewhat obese.  Tender in the right lower quadrant with  guarding.  There was erythema of the right lower abdominal wall and  right flank but no mass, hernia or crepitance.  He was soft in the upper  abdomen.  He was also tender in the suprapubic area as well as the right  anterior thigh.  There was erythema of the right anterior thigh  suggesting cellulitis, no hernia was detected.  GENITOURINARY:  Penis, scrotum and testes looked fine, both testicles  were in the scrotum.  RECTAL: External exam showed no sign of infection  or tenderness or inflammation.   HOSPITAL COURSE:  The patient was  admitted and started on intravenous  vancomycin and Primaxin.  He was admitted to the step-down unit for  close monitoring, and fluid resuscitation.  A CT scan showed significant  cellulitis of the right flank and right thigh but no air in the tissues  and no fasciitis, no myositis and no abscess.  Intra-abdominal contents  looked normal   He was observed overnight.  The following morning he still appeared  toxic with tachycardia and significant leukocytosis.  His exam showed no  evidence of any crepitance or bullae but the erythema was spreading  somewhat.  I repeated his CT scan and it showed progression of the edema  and cellulitis, possibly some lucencies either fat or fluid in the  deeper subcutaneous fat.  I was concerned and advised the patient to  undergo wound exploration in the OR.   The patient was taken to operating room on December 30, 2007.  I made  oblique incisions in the right flank, right lower quadrant abdominal  wall and right thigh.  I found purulent material in the plane between  the deep subcutaneous fat and the muscle fascia.  In a couple of  areas I  actually incised the deep muscle fascia but found that the muscle was  healthy.  There was no sign of any fasciitis, there was no sign of any  myositis.  The cultures were taken of the suppurative fluid.   The cultures ultimately came back as group A Streptococcus.  Infectious  disease consultation with Dr. Cliffton Asters was obtained.  He was  initially treated with switching his antibiotics to Ancef and  clindamycin and after a few days as he got better we simply gave him  Ancef for the entire 2-week duration of his hospital course.   The patient was return to the operating room on December 31, 2007 for a  planned dressing change.  I found no necrotic tissue, minimal purulence.  I simply irrigated the wounds and repacked them.   I asked Dr. Primitivo Gauze of the renal service to see him and he  felt that his acute renal failure was due to combination of acute  dehydration, toxicity and incidental use of the lisinopril that he was  taken.  His lisinopril had been discontinued on admission.  His renal  insufficiency improved steadily throughout his hospital course and did  reverse with hydration and resolution of his sepsis.   The patient was returned the operating room one more time on January 02, 2008 for irrigation and debridement.  We debrided just a little bit  of subcutaneous tissue but the wounds were looking better.  This was the  last time that he required operative intervention and he was able to  undergo dressing changes at the bedside by the wound nurse and the ICU  nurses from there on out.   The patient was placed on hyperalimentation shortly after admission.  We  tried to advance his diet but he remained somewhat distended.  A GI  consultation was obtained.  A rectal tube was placed which helped him  evacuate some fluid, stool and a lot of gas and that helped.  Ultimately  his colonic ileus resolved and we were able to get the rectal tube out  and advance  his diet.  He was maintained on hyperalimentation for the  majority of his hospital course, however.   He had a lot of anasarca and edema including the penis, scrotum and  testicular areas.  We kept  his Foley catheter in for about 10 days and  ultimately got that out and he had no trouble voiding after that.  He  was given Lasix 2 or 3 times to mobilize fluid and that worked well.  His colonic ileus resolved.  His acute renal failure resolved.   He was discharged on January 12, 2008.  At time he was tolerating his diet,  having bowel movements, ambulatory, still sore but the nurses were doing  a good job with changing his bandages.  Home health nursing was arranged  to help with b.i.d. dressing changes.   DISCHARGE MEDICATIONS:  1. Vicodin ES for pain.  2. Augmentin 875 mg twice a day for 10 days.   He was asked to come back and see me in the office in 2 weeks, sooner if  there is any problems.      Angelia Mould. Derrell Lolling, M.D.  Electronically Signed     HMI/MEDQ  D:  01/23/2008  T:  01/23/2008  Job:  [id number]   cc:   Quita Skye. Artis Flock, M.D.  Fax: [phone]   Cliffton Asters, M.D.  Fax: [phone]   Dyke Maes, M.D.  Fax: [phone]   Rachael Fee, MD  [address]  [place], [place] [address]

## 2011-06-06 ENCOUNTER — Encounter: Payer: Self-pay | Admitting: Internal Medicine

## 2011-06-06 DIAGNOSIS — Z0000 Encounter for general adult medical examination without abnormal findings: Secondary | ICD-10-CM | POA: Insufficient documentation

## 2011-06-08 ENCOUNTER — Ambulatory Visit (INDEPENDENT_AMBULATORY_CARE_PROVIDER_SITE_OTHER): Payer: BC Managed Care – PPO | Admitting: Internal Medicine

## 2011-06-08 ENCOUNTER — Encounter: Payer: Self-pay | Admitting: Internal Medicine

## 2011-06-08 VITALS — BP 100/72 | HR 72 | Temp 98.1°F | Ht 73.0 in | Wt 310.2 lb

## 2011-06-08 DIAGNOSIS — R1032 Left lower quadrant pain: Secondary | ICD-10-CM | POA: Insufficient documentation

## 2011-06-08 DIAGNOSIS — M7662 Achilles tendinitis, left leg: Secondary | ICD-10-CM

## 2011-06-08 DIAGNOSIS — Z0000 Encounter for general adult medical examination without abnormal findings: Secondary | ICD-10-CM

## 2011-06-08 DIAGNOSIS — I10 Essential (primary) hypertension: Secondary | ICD-10-CM

## 2011-06-08 DIAGNOSIS — M7660 Achilles tendinitis, unspecified leg: Secondary | ICD-10-CM

## 2011-06-08 HISTORY — DX: Achilles tendinitis, left leg: M76.62

## 2011-06-08 MED ORDER — NAPROXEN 500 MG PO TABS: 500.0000 mg | ORAL_TABLET | Freq: Two times a day (BID) | ORAL | Status: AC

## 2011-06-08 NOTE — Assessment & Plan Note (Signed)
stable overall by hx and exam, most recent data reviewed with pt, and pt to continue medical treatment as before  BP Readings from Last 3 Encounters:  06/08/11 100/72  09/01/10 120/78  12/13/08 142/90

## 2011-06-08 NOTE — Progress Notes (Signed)
Subjective:    Patient ID: Benjamin Paul, male    DOB: [birth year], 45 y.o.   MRN: [id number]  HPI Here with c/o post left heel pain, mild to mod, worse to stand and walk longer distances and time but functions ok at work and home, no falls, gait change, trauma, swelling , redness or prior hx of same, worse overall for 2 mo. Also c/o pain to the left inguinal area, seems worse in the am, better soone after gettting out of bed, better for the rest of the day, not worse with ambulation and denies recurring LBP without change in severity, bowel or bladder change, fever, wt loss,  worsening LE pain/numbness/weakness, gait change or falls. Inguinal pain mild, 2 mo, and unable to tell if sweling due to obesity. Pt denies chest pain, increased sob or doe, wheezing, orthopnea, PND, increased LE swelling, palpitations, dizziness or syncope.  Pt denies new neurological symptoms such as new headache, or facial or extremity weakness or numbness   Pt denies fever, wt loss, night sweats, loss of appetite, or other constitutional symptoms.  Denies urinary symptoms such as dysuria, frequency, urgency,or hematuria, and denies bowel change as well.  Denies worsening depressive symptoms, suicidal ideation, or panic Past Medical History  Diagnosis Date  . DISC DISEASE, LUMBAR 12/13/2008  . HYPERLIPIDEMIA 12/13/2008  . HYPERTENSION 12/13/2008  . NEPHROLITHIASIS, HX OF 12/13/2008  . Achilles tendinitis of left lower extremity 06/08/2011   Past Surgical History  Procedure Date  . Rotator cuff repair     Dr. Priscille Kluver    reports that he has never smoked. He does not have any smokeless tobacco history on file. He reports that he drinks alcohol. He reports that he does not use illicit drugs. family history includes Cancer in his other; Diabetes in his father; Heart disease in his father; Hyperlipidemia in his father; Hypertension in his father and others; and Stroke in his father. No Known Allergies Current Outpatient Prescriptions  on File Prior to Visit  Medication Sig Dispense Refill  . aspirin 81 MG tablet Take 81 mg by mouth daily.        Marland Kitchen lisinopril (PRINIVIL,ZESTRIL) 10 MG tablet Take 10 mg by mouth daily.        . cyclobenzaprine (FLEXERIL) 5 MG tablet Take 5 mg by mouth 3 (three) times daily as needed.         Review of Systems Review of Systems  Constitutional: Negative for diaphoresis and unexpected weight change.  HENT: Negative for drooling and tinnitus.   Eyes: Negative for photophobia and visual disturbance.  Respiratory: Negative for choking and stridor.   Gastrointestinal: Negative for vomiting and blood in stool.  Genitourinary: Negative for hematuria and decreased urine volume.  Musculoskeletal: Negative for gait problem.  Skin: Negative for color change and wound.  Neurological: Negative for tremors and numbness.  Psychiatric/Behavioral: Negative for decreased concentration. The patient is not hyperactive.       Objective:   Physical Exam BP 100/72  Pulse 72  Temp(Src) 98.1 F (36.7 C) (Oral)  Ht 6\' 1"  (1.854 m)  Wt 310 lb 4 oz (140.728 kg)  BMI 40.93 kg/m2  SpO2 96% Physical Exam  VS noted Constitutional: Pt appears well-developed and well-nourished.  HENT: Head: Normocephalic.  Right Ear: External ear normal.  Left Ear: External ear normal.  Eyes: Conjunctivae and EOM are normal. Pupils are equal, round, and reactive to light.  Neck: Normal range of motion. Neck supple.  Cardiovascular: Normal rate and regular rhythm.  Pulmonary/Chest: Effort normal and breath sounds normal.  Abd:  Soft, NT, non-distended, + BS Neurological: Pt is alert. No cranial nerve deficit.  Skin: Skin is warm. No erythema.  Psychiatric: Pt behavior is normal. Thought content normal. 1+ nervous Mild tender left achilles tender, with mild soft tissue swelling.       Assessment & Plan:

## 2011-06-08 NOTE — Assessment & Plan Note (Signed)
Exam benign, cannot r/o LIH - to gen surg for opinion/eval

## 2011-06-08 NOTE — Assessment & Plan Note (Signed)
Mild to mod, for nsiad prn,  to f/u any worsening symptoms or concerns 

## 2011-06-08 NOTE — Patient Instructions (Addendum)
Take all new medications as prescribed Continue all other medications as before You will be contacted regarding the referral for: General Surgury Please return in 3 months with Lab testing done 3-5 days before (the office will call to help schedule)

## 2011-06-25 ENCOUNTER — Other Ambulatory Visit: Payer: Self-pay | Admitting: Family Medicine

## 2011-06-28 ENCOUNTER — Ambulatory Visit
Admission: RE | Admit: 2011-06-28 | Discharge: 2011-06-28 | Disposition: A | Discharge status: Home / Self Care | Payer: BC Managed Care – PPO | Source: Ambulatory Visit | Attending: Family Medicine | Admitting: Family Medicine

## 2011-06-28 MED ORDER — IOHEXOL 300 MG/ML  SOLN
125.0000 mL | Freq: Once | INTRAMUSCULAR | Status: AC | PRN
  Administered 2011-06-28: 125 mL via INTRAVENOUS

## 2011-07-02 ENCOUNTER — Encounter (INDEPENDENT_AMBULATORY_CARE_PROVIDER_SITE_OTHER): Payer: BC Managed Care – PPO | Admitting: General Surgery

## 2011-07-26 ENCOUNTER — Ambulatory Visit (INDEPENDENT_AMBULATORY_CARE_PROVIDER_SITE_OTHER): Payer: BC Managed Care – PPO | Admitting: General Surgery

## 2011-07-26 ENCOUNTER — Encounter (INDEPENDENT_AMBULATORY_CARE_PROVIDER_SITE_OTHER): Payer: Self-pay | Admitting: General Surgery

## 2011-07-26 VITALS — BP 142/88 | HR 76 | Temp 97.6°F | Resp 16 | Ht 73.0 in | Wt 304.8 lb

## 2011-07-26 DIAGNOSIS — S335XXA Sprain of ligaments of lumbar spine, initial encounter: Secondary | ICD-10-CM

## 2011-07-26 DIAGNOSIS — S39012A Strain of muscle, fascia and tendon of lower back, initial encounter: Secondary | ICD-10-CM

## 2011-07-26 NOTE — Progress Notes (Signed)
Chief Complaint  Patient presents with  . Pain    Eval abdominal pain left side    HPI Benjamin Paul is a 45 y.o. male.    This gentleman is referred back to me by Dr. Sigmund Hazel and Dr. Oliver Barre for evaluation of left groin pain and left back and sacral pain. He states that he developed left inguinal pain about 2 months ago. For the past 2 weeks he also gets left back pain in the sacroiliac area and actually the back pain has gotten worse than the left inguinal pain. He describes this as brought on or exacerbated by certain positions like leaning to the left. He says rarely the pain radiates to his left testicle. The pain is aching or sharp, not burning in character. He's not had a bulge in his groin. He denies any fever. He has normal appetite and no gastrointestinal symptoms. He has no urinary tract symptoms. This is a new problem for him.  Significant past history is that he developed a life-threatening soft tissue infection of his right abdominal wall and right thigh and 2009. This was necrotizing group A streptococcal infection. All of his wounds have healed and that infection is resolved.  He has never had a hernia in the past. HPI  Past Medical History  Diagnosis Date  . DISC DISEASE, LUMBAR 12/13/2008  . HYPERLIPIDEMIA 12/13/2008  . HYPERTENSION 12/13/2008  . NEPHROLITHIASIS, HX OF 12/13/2008  . Achilles tendinitis of left lower extremity 06/08/2011  . Abdominal pain     Past Surgical History  Procedure Date  . Rotator cuff repair     Dr. Priscille Kluver    Family History  Problem Relation Age of Onset  . Hyperlipidemia Father   . Heart disease Father   . Hypertension Father   . Stroke Father   . Diabetes Father   . Cancer Father     melanoma - skin  . Cancer Other     breast cancer  . Hypertension Other   . Hypertension Other   . Cancer Maternal Grandmother     ovarian    Social History History  Substance Use Topics  . Smoking status: Never Smoker   . Smokeless  tobacco: Not on file  . Alcohol Use: Yes     rare    No Known Allergies  Current Outpatient Prescriptions  Medication Sig Dispense Refill  . aspirin 81 MG tablet Take 81 mg by mouth daily.        . cyclobenzaprine (FLEXERIL) 5 MG tablet Take 5 mg by mouth 3 (three) times daily as needed.       Marland Kitchen lisinopril (PRINIVIL,ZESTRIL) 10 MG tablet Take 10 mg by mouth daily.        . naproxen (NAPROSYN) 500 MG tablet Take 1 tablet (500 mg total) by mouth 2 (two) times daily with a meal.  60 tablet  2    Review of Systems Review of Systems  Constitutional: Negative.   HENT: Negative.   Eyes: Negative.   Respiratory: Negative.   Cardiovascular: Negative.   Gastrointestinal: Negative.   Genitourinary: Negative.   Musculoskeletal: Positive for myalgias and back pain. Negative for joint swelling, arthralgias and gait problem.  Skin: Negative.   Neurological: Negative.   Hematological: Negative.   Psychiatric/Behavioral: Negative.     Blood pressure 142/88, pulse 76, temperature 97.6 F (36.4 C), temperature source Temporal, resp. rate 16, height 6\' 1"  (1.854 Paul), weight 304 lb 12.8 oz (138.256 kg).  Physical Exam Physical  Exam  Constitutional: He is oriented to person, place, and time. He appears well-developed and well-nourished. No distress.  HENT:  Head: Normocephalic and atraumatic.  Nose: Nose normal.  Mouth/Throat: No oropharyngeal exudate.  Eyes: EOM are normal. Left eye exhibits no discharge. No scleral icterus.  Neck: Neck supple. No JVD present. No tracheal deviation present. No thyromegaly present.  Cardiovascular: Normal rate, regular rhythm, normal heart sounds and intact distal pulses.   No murmur heard. Pulmonary/Chest: Effort normal and breath sounds normal. No respiratory distress. He has no wheezes. He has no rales.  Abdominal: Soft. Bowel sounds are normal. He exhibits no distension and no mass. There is no tenderness. There is no rebound and no guarding.     Genitourinary: Penis normal.     Musculoskeletal: Normal range of motion. He exhibits no edema and no tenderness.  Lymphadenopathy:    He has no cervical adenopathy.  Neurological: He is alert and oriented to person, place, and time. He exhibits normal muscle tone.  Skin: Skin is warm and dry. No rash noted. No erythema. No pallor.  Psychiatric: He has a normal mood and affect. His behavior is normal. Judgment and thought content normal.    Data Reviewed I have reviewed my old records. I've reviewed office notes from Dr. Sigmund Hazel and Dr. Oliver Barre. I also reviewed his CT scan.  Assessment    Left groin pain and left lower back pain. This sounds like musculoskeletal problems to me such as lumbar strain or nerve compression.  There is no evidence of any inguinal or abdominal wall hernia.  CT scan of the abdomen is negative. I doubt visceral disease in the abdomen as a cause of his pain.  History of group A streptococcal soft tissue infection 2009. This has resolved.    Plan    The patient is referred back to his primary care physician. I suggested referral to a musculoskeletal specialist such as an orthopedic surgeon who specializes in spine and pelvic disease.  Return to see me p.r.n.       Benjamin Paul 07/26/2011, 10:01 AM

## 2011-07-26 NOTE — Patient Instructions (Signed)
I think that the cause of your left groin pain and left back pain is of a musculoskeletal cause. You may have arthritis in your spine or pelvis. There may be some nerve compression. There is no evidence of hernia. There is no evidence of any significant abnormality on your CT scan. This does not sound like a kidney stone. You are referred back to your primary care physician, and I would advise that they refer you to a musculoskeletal specialist such as an orthopedic surgeon who specializes in spine and pelvic disease. Return to see me as needed.

## 2011-07-30 LAB — BASIC METABOLIC PANEL
BUN: 39 — ABNORMAL HIGH
BUN: 43 — ABNORMAL HIGH
BUN: 51 — ABNORMAL HIGH
BUN: 18
BUN: 19
CO2: 15 — ABNORMAL LOW
CO2: 17 — ABNORMAL LOW
CO2: 17 — ABNORMAL LOW
CO2: 18 — ABNORMAL LOW
CO2: 23
Calcium: 6.4 — CL
Calcium: 6.6 — ABNORMAL LOW
Calcium: 6.8 — ABNORMAL LOW
Calcium: 7.2 — ABNORMAL LOW
Calcium: 7.8 — ABNORMAL LOW
Calcium: 7.9 — ABNORMAL LOW
Chloride: 108
Chloride: 109
Chloride: 95 — ABNORMAL LOW
Chloride: 98
Creatinine, Ser: 2.4 — ABNORMAL HIGH
Creatinine, Ser: 2.63 — ABNORMAL HIGH
Creatinine, Ser: 2.76 — ABNORMAL HIGH
Creatinine, Ser: 2.86 — ABNORMAL HIGH
Creatinine, Ser: 1
Creatinine, Ser: 1.03
GFR calc Af Amer: 29 — ABNORMAL LOW
GFR calc Af Amer: 36 — ABNORMAL LOW
GFR calc Af Amer: >60
GFR calc Af Amer: >60
GFR calc non Af Amer: >60
GFR calc non Af Amer: >60
Glucose, Bld: 102 — ABNORMAL HIGH
Glucose, Bld: 103 — ABNORMAL HIGH
Glucose, Bld: 117 — ABNORMAL HIGH
Glucose, Bld: 127 — ABNORMAL HIGH
Glucose, Bld: 105 — ABNORMAL HIGH
Potassium: 3.5
Potassium: 3.9
Potassium: 4.5
Sodium: 137

## 2011-07-30 LAB — CBC
HCT: 32.3 — ABNORMAL LOW
HCT: 32.7 — ABNORMAL LOW
HCT: 29.9 — ABNORMAL LOW
HCT: 30.4 — ABNORMAL LOW
Hemoglobin: 10.2 — ABNORMAL LOW
Hemoglobin: 10.4 — ABNORMAL LOW
Hemoglobin: 12.2 — ABNORMAL LOW
Hemoglobin: 12.9 — ABNORMAL LOW
MCHC: 34.2
MCHC: 34.3
MCHC: 34.4
MCHC: 34.6
MCHC: 34.7
MCHC: 34.8
MCHC: 34.9
MCHC: 34
MCHC: 34.6
MCV: 86.7
MCV: 86.9
MCV: 87.2
MCV: 87.8
MCV: 88.2
MCV: 86.7
MCV: 87.3
MCV: 87.9
Platelets: 256
Platelets: 258
Platelets: 289
Platelets: 272
Platelets: 340
Platelets: 630 — ABNORMAL HIGH
RBC: 3.38 — ABNORMAL LOW
RBC: 3.46 — ABNORMAL LOW
RBC: 4.2 — ABNORMAL LOW
RBC: 4.28
RBC: 4.62
RBC: 3.47 — ABNORMAL LOW
RBC: 3.51 — ABNORMAL LOW
RDW: 13.4
RDW: 13.4
RDW: 13.5
RDW: 14
RDW: 14.2
WBC: 16.5 — ABNORMAL HIGH
WBC: 21.6 — ABNORMAL HIGH
WBC: 26.1 — ABNORMAL HIGH
WBC: 12 — ABNORMAL HIGH
WBC: 7.3

## 2011-07-30 LAB — HEPATIC FUNCTION PANEL
ALT: 78 — ABNORMAL HIGH
AST: 66 — ABNORMAL HIGH
Bilirubin, Direct: 0.8 — ABNORMAL HIGH
Indirect Bilirubin: 0.9
Total Bilirubin: 1.7 — ABNORMAL HIGH

## 2011-07-30 LAB — ANAEROBIC CULTURE

## 2011-07-30 LAB — COMPREHENSIVE METABOLIC PANEL
ALT: 34
ALT: 25
AST: 39 — ABNORMAL HIGH
AST: 32
AST: 43 — ABNORMAL HIGH
Albumin: 1.5 — ABNORMAL LOW
Albumin: 1.5 — ABNORMAL LOW
Albumin: 1.6 — ABNORMAL LOW
Albumin: 1.9 — ABNORMAL LOW
Alkaline Phosphatase: 49
BUN: 47 — ABNORMAL HIGH
BUN: 54 — ABNORMAL HIGH
BUN: 20
CO2: 18 — ABNORMAL LOW
CO2: 22
CO2: 24
Calcium: 7 — ABNORMAL LOW
Calcium: 7.8 — ABNORMAL LOW
Calcium: 7.7 — ABNORMAL LOW
Chloride: 104
Chloride: 100
Creatinine, Ser: 1.98 — ABNORMAL HIGH
Creatinine, Ser: 2.73 — ABNORMAL HIGH
Creatinine, Ser: 2.93 — ABNORMAL HIGH
Creatinine, Ser: 0.91
Creatinine, Ser: 1.11
GFR calc Af Amer: >60
GFR calc Af Amer: >60
GFR calc non Af Amer: 24 — ABNORMAL LOW
GFR calc non Af Amer: 26 — ABNORMAL LOW
GFR calc non Af Amer: >60
GFR calc non Af Amer: >60
Glucose, Bld: 118 — ABNORMAL HIGH
Glucose, Bld: 92
Potassium: 4.1
Sodium: 139
Sodium: 128 — ABNORMAL LOW
Total Bilirubin: 2.1 — ABNORMAL HIGH
Total Bilirubin: 2.9 — ABNORMAL HIGH
Total Bilirubin: 0.6
Total Bilirubin: 0.8
Total Protein: 5 — ABNORMAL LOW
Total Protein: 5.2 — ABNORMAL LOW

## 2011-07-30 LAB — APTT: aPTT: 22 — ABNORMAL LOW

## 2011-07-30 LAB — CROSSMATCH
ABO/RH(D): O POS
Antibody Screen: NEGATIVE

## 2011-07-30 LAB — BLOOD GAS, ARTERIAL
Acid-base deficit: 7.5 — ABNORMAL HIGH
O2 Saturation: 92.7
Patient temperature: 98.6
pO2, Arterial: 68 — ABNORMAL LOW

## 2011-07-30 LAB — DIFFERENTIAL
Band Neutrophils: 5
Basophils Absolute: 0
Basophils Relative: 1
Eosinophils Absolute: 0.2
Eosinophils Relative: 1
Eosinophils Relative: 2
Eosinophils Relative: 3
Lymphocytes Relative: 11 — ABNORMAL LOW
Lymphs Abs: 1.3
Metamyelocytes Relative: 0
Monocytes Absolute: 1.2 — ABNORMAL HIGH
Monocytes Relative: 6
Monocytes Relative: 7
Myelocytes: 0
Neutro Abs: 9.8 — ABNORMAL HIGH
Neutrophils Relative %: 86 — ABNORMAL HIGH
nRBC: 0

## 2011-07-30 LAB — WOUND CULTURE: Gram Stain: NONE SEEN

## 2011-07-30 LAB — TRIGLYCERIDES: Triglycerides: 160 — ABNORMAL HIGH

## 2011-07-30 LAB — URINE CULTURE
Colony Count: NO GROWTH
Special Requests: NEGATIVE

## 2011-07-30 LAB — GRAM STAIN

## 2011-07-30 LAB — URINALYSIS, ROUTINE W REFLEX MICROSCOPIC
Glucose, UA: NEGATIVE
Hgb urine dipstick: NEGATIVE
Specific Gravity, Urine: 1.021
pH: 5

## 2011-07-30 LAB — ABO/RH: ABO/RH(D): O POS

## 2011-07-30 LAB — PROTIME-INR
INR: 1.4
INR: 1.5
INR: 1.6 — ABNORMAL HIGH
Prothrombin Time: 18.1 — ABNORMAL HIGH
Prothrombin Time: 18.5 — ABNORMAL HIGH
Prothrombin Time: 19.5 — ABNORMAL HIGH

## 2011-07-30 LAB — URINE MICROSCOPIC-ADD ON

## 2011-07-30 LAB — PHOSPHORUS
Phosphorus: 4.2
Phosphorus: 4

## 2011-07-30 LAB — PREALBUMIN: Prealbumin: 5.1 — ABNORMAL LOW

## 2011-07-30 LAB — CULTURE, BLOOD (ROUTINE X 2): Culture: NO GROWTH

## 2011-07-30 LAB — TISSUE CULTURE

## 2011-07-30 LAB — CHOLESTEROL, TOTAL: Cholesterol: 108

## 2011-07-30 LAB — MYOGLOBIN, URINE: Myoglobin, Ur: <27 mcg/L (ref <28)

## 2011-07-30 LAB — MAGNESIUM: Magnesium: 2.1

## 2012-12-29 ENCOUNTER — Other Ambulatory Visit: Payer: Self-pay | Admitting: Gastroenterology

## 2013-02-10 ENCOUNTER — Other Ambulatory Visit: Payer: Self-pay | Admitting: Dermatology

## 2014-03-04 ENCOUNTER — Other Ambulatory Visit: Payer: Self-pay | Admitting: Dermatology

## 2016-04-12 DIAGNOSIS — Z808 Family history of malignant neoplasm of other organs or systems: Secondary | ICD-10-CM | POA: Diagnosis not present

## 2016-04-12 DIAGNOSIS — Z86018 Personal history of other benign neoplasm: Secondary | ICD-10-CM | POA: Diagnosis not present

## 2016-04-12 DIAGNOSIS — D225 Melanocytic nevi of trunk: Secondary | ICD-10-CM | POA: Diagnosis not present

## 2016-04-12 DIAGNOSIS — D2272 Melanocytic nevi of left lower limb, including hip: Secondary | ICD-10-CM | POA: Diagnosis not present

## 2016-05-23 DIAGNOSIS — E669 Obesity, unspecified: Secondary | ICD-10-CM | POA: Diagnosis not present

## 2016-05-23 DIAGNOSIS — E782 Mixed hyperlipidemia: Secondary | ICD-10-CM | POA: Diagnosis not present

## 2016-05-23 DIAGNOSIS — I10 Essential (primary) hypertension: Secondary | ICD-10-CM | POA: Diagnosis not present

## 2016-05-23 DIAGNOSIS — M545 Low back pain: Secondary | ICD-10-CM | POA: Diagnosis not present

## 2016-07-02 DIAGNOSIS — I10 Essential (primary) hypertension: Secondary | ICD-10-CM | POA: Diagnosis not present

## 2016-07-02 DIAGNOSIS — M545 Low back pain: Secondary | ICD-10-CM | POA: Diagnosis not present

## 2016-07-02 DIAGNOSIS — H6123 Impacted cerumen, bilateral: Secondary | ICD-10-CM | POA: Diagnosis not present

## 2016-07-02 DIAGNOSIS — E669 Obesity, unspecified: Secondary | ICD-10-CM | POA: Diagnosis not present

## 2016-09-10 DIAGNOSIS — E669 Obesity, unspecified: Secondary | ICD-10-CM | POA: Diagnosis not present

## 2016-09-10 DIAGNOSIS — I10 Essential (primary) hypertension: Secondary | ICD-10-CM | POA: Diagnosis not present

## 2016-09-10 DIAGNOSIS — B009 Herpesviral infection, unspecified: Secondary | ICD-10-CM | POA: Diagnosis not present

## 2016-11-07 DIAGNOSIS — E669 Obesity, unspecified: Secondary | ICD-10-CM | POA: Diagnosis not present

## 2017-01-21 DIAGNOSIS — N529 Male erectile dysfunction, unspecified: Secondary | ICD-10-CM | POA: Diagnosis not present

## 2017-01-21 DIAGNOSIS — I10 Essential (primary) hypertension: Secondary | ICD-10-CM | POA: Diagnosis not present

## 2017-01-21 DIAGNOSIS — E669 Obesity, unspecified: Secondary | ICD-10-CM | POA: Diagnosis not present

## 2017-03-26 DIAGNOSIS — Z86018 Personal history of other benign neoplasm: Secondary | ICD-10-CM | POA: Diagnosis not present

## 2017-03-26 DIAGNOSIS — D225 Melanocytic nevi of trunk: Secondary | ICD-10-CM | POA: Diagnosis not present

## 2017-03-26 DIAGNOSIS — D2371 Other benign neoplasm of skin of right lower limb, including hip: Secondary | ICD-10-CM | POA: Diagnosis not present

## 2017-03-26 DIAGNOSIS — Z808 Family history of malignant neoplasm of other organs or systems: Secondary | ICD-10-CM | POA: Diagnosis not present

## 2017-07-24 DIAGNOSIS — I10 Essential (primary) hypertension: Secondary | ICD-10-CM | POA: Diagnosis not present

## 2017-07-24 DIAGNOSIS — E669 Obesity, unspecified: Secondary | ICD-10-CM | POA: Diagnosis not present

## 2017-07-24 DIAGNOSIS — E782 Mixed hyperlipidemia: Secondary | ICD-10-CM | POA: Diagnosis not present

## 2017-07-24 DIAGNOSIS — R39198 Other difficulties with micturition: Secondary | ICD-10-CM | POA: Diagnosis not present

## 2017-07-24 DIAGNOSIS — Z125 Encounter for screening for malignant neoplasm of prostate: Secondary | ICD-10-CM | POA: Diagnosis not present

## 2017-10-25 DIAGNOSIS — E669 Obesity, unspecified: Secondary | ICD-10-CM | POA: Diagnosis not present

## 2017-10-25 DIAGNOSIS — Z713 Dietary counseling and surveillance: Secondary | ICD-10-CM | POA: Diagnosis not present

## 2017-10-25 DIAGNOSIS — Z6834 Body mass index (BMI) 34.0-34.9, adult: Secondary | ICD-10-CM | POA: Diagnosis not present

## 2017-11-14 DIAGNOSIS — Z23 Encounter for immunization: Secondary | ICD-10-CM | POA: Diagnosis not present

## 2018-01-27 DIAGNOSIS — Z23 Encounter for immunization: Secondary | ICD-10-CM | POA: Diagnosis not present

## 2018-01-29 DIAGNOSIS — M545 Low back pain: Secondary | ICD-10-CM | POA: Diagnosis not present

## 2018-01-29 DIAGNOSIS — B009 Herpesviral infection, unspecified: Secondary | ICD-10-CM | POA: Diagnosis not present

## 2018-01-29 DIAGNOSIS — E669 Obesity, unspecified: Secondary | ICD-10-CM | POA: Diagnosis not present

## 2018-03-26 DIAGNOSIS — D2371 Other benign neoplasm of skin of right lower limb, including hip: Secondary | ICD-10-CM | POA: Diagnosis not present

## 2018-03-26 DIAGNOSIS — Z808 Family history of malignant neoplasm of other organs or systems: Secondary | ICD-10-CM | POA: Diagnosis not present

## 2018-03-26 DIAGNOSIS — Z86018 Personal history of other benign neoplasm: Secondary | ICD-10-CM | POA: Diagnosis not present

## 2018-03-26 DIAGNOSIS — D225 Melanocytic nevi of trunk: Secondary | ICD-10-CM | POA: Diagnosis not present

## 2018-04-16 DIAGNOSIS — Z86010 Personal history of colonic polyps: Secondary | ICD-10-CM | POA: Diagnosis not present

## 2018-05-02 DIAGNOSIS — Z6833 Body mass index (BMI) 33.0-33.9, adult: Secondary | ICD-10-CM | POA: Diagnosis not present

## 2018-05-02 DIAGNOSIS — R3912 Poor urinary stream: Secondary | ICD-10-CM | POA: Diagnosis not present

## 2018-05-02 DIAGNOSIS — Z713 Dietary counseling and surveillance: Secondary | ICD-10-CM | POA: Diagnosis not present

## 2018-05-02 DIAGNOSIS — E669 Obesity, unspecified: Secondary | ICD-10-CM | POA: Diagnosis not present

## 2018-08-05 DIAGNOSIS — N529 Male erectile dysfunction, unspecified: Secondary | ICD-10-CM | POA: Diagnosis not present

## 2018-08-05 DIAGNOSIS — Z23 Encounter for immunization: Secondary | ICD-10-CM | POA: Diagnosis not present

## 2018-08-05 DIAGNOSIS — E669 Obesity, unspecified: Secondary | ICD-10-CM | POA: Diagnosis not present

## 2018-08-05 DIAGNOSIS — N401 Enlarged prostate with lower urinary tract symptoms: Secondary | ICD-10-CM | POA: Diagnosis not present

## 2018-08-05 DIAGNOSIS — Z13220 Encounter for screening for lipoid disorders: Secondary | ICD-10-CM | POA: Diagnosis not present

## 2018-12-05 DIAGNOSIS — E669 Obesity, unspecified: Secondary | ICD-10-CM | POA: Diagnosis not present

## 2018-12-05 DIAGNOSIS — Z713 Dietary counseling and surveillance: Secondary | ICD-10-CM | POA: Diagnosis not present

## 2019-03-09 DIAGNOSIS — R5383 Other fatigue: Secondary | ICD-10-CM | POA: Diagnosis not present

## 2019-05-01 DIAGNOSIS — D225 Melanocytic nevi of trunk: Secondary | ICD-10-CM | POA: Diagnosis not present

## 2019-05-01 DIAGNOSIS — D2371 Other benign neoplasm of skin of right lower limb, including hip: Secondary | ICD-10-CM | POA: Diagnosis not present

## 2019-05-01 DIAGNOSIS — Z808 Family history of malignant neoplasm of other organs or systems: Secondary | ICD-10-CM | POA: Diagnosis not present

## 2019-05-01 DIAGNOSIS — Z86018 Personal history of other benign neoplasm: Secondary | ICD-10-CM | POA: Diagnosis not present

## 2019-06-03 DIAGNOSIS — R5383 Other fatigue: Secondary | ICD-10-CM | POA: Diagnosis not present

## 2019-06-08 DIAGNOSIS — E669 Obesity, unspecified: Secondary | ICD-10-CM | POA: Diagnosis not present

## 2019-06-08 DIAGNOSIS — Z713 Dietary counseling and surveillance: Secondary | ICD-10-CM | POA: Diagnosis not present

## 2019-06-08 DIAGNOSIS — Z6833 Body mass index (BMI) 33.0-33.9, adult: Secondary | ICD-10-CM | POA: Diagnosis not present

## 2019-11-12 DIAGNOSIS — M7711 Lateral epicondylitis, right elbow: Secondary | ICD-10-CM | POA: Diagnosis not present

## 2019-11-12 DIAGNOSIS — M25521 Pain in right elbow: Secondary | ICD-10-CM | POA: Diagnosis not present

## 2019-11-23 DIAGNOSIS — E669 Obesity, unspecified: Secondary | ICD-10-CM | POA: Diagnosis not present

## 2019-11-23 DIAGNOSIS — Z6834 Body mass index (BMI) 34.0-34.9, adult: Secondary | ICD-10-CM | POA: Diagnosis not present

## 2019-11-23 DIAGNOSIS — Z713 Dietary counseling and surveillance: Secondary | ICD-10-CM | POA: Diagnosis not present

## 2021-08-05 DIAGNOSIS — I480 Paroxysmal atrial fibrillation: Secondary | ICD-10-CM

## 2021-08-05 HISTORY — DX: Paroxysmal atrial fibrillation: I48.0

## 2021-09-01 NOTE — Progress Notes (Signed)
Patient referred by [name], MD for tachycardia  Subjective:   Benjamin Paul, male    DOB: 1966-01-25, 55 y.o.   MRN: [id number]   Chief Complaint  Patient presents with   Tachycardia   New Patient (Initial Visit)    Referred by [name], MD    HPI  55 y.o. Caucasian male with hypertension, mixed hyperlipidemia, referred for evaluation of tachycardia  Patient works a Network engineer job with maintenance.  He stays active with regular physical activity, including weight training as well as aerobic activity.  He denies any episodes of chest pain or shortness of breath, or tachycardia during his physical activity.  However, he has noticed episodes of rapid heart rate in 140s, captured on his apple watch, occurring for few minutes at a time.  Episodes generally occur in the evenings, while at rest.  During these episodes, he does report feelings of "lump in throat ", as well as jaw pain.  Patient's wife does endorses patient snoring at night.  He has never had a sleep study.  Blood pressure and cholesterol is elevated.   Past Medical History:  Diagnosis Date   Abdominal pain    Achilles tendinitis of left lower extremity 06/08/2011   [place] DISEASE, LUMBAR 12/13/2008   HYPERLIPIDEMIA 12/13/2008   HYPERTENSION 12/13/2008   NEPHROLITHIASIS, HX OF 12/13/2008     Past Surgical History:  Procedure Laterality Date   ROTATOR CUFF REPAIR     Dr. Telford Nab     Social History   Tobacco Use  Smoking Status Never  Smokeless Tobacco Not on file    Social History   Substance and Sexual Activity  Alcohol Use Yes   Comment: rare     Family History  Problem Relation Age of Onset   Hyperlipidemia Father    Heart disease Father    Hypertension Father    Stroke Father    Diabetes Father    Cancer Father        melanoma - skin   Cancer Other        breast cancer   Hypertension Other    Hypertension Other    Cancer Maternal Grandmother        ovarian     Current Outpatient  Medications on File Prior to Visit  Medication Sig Dispense Refill   Cholecalciferol (HM VITAMIN D3) 100 MCG (4000 UT) CAPS Take by mouth daily.     cyclobenzaprine (FLEXERIL) 5 MG tablet Take 5 mg by mouth 3 (three) times daily as needed.      metoprolol succinate (TOPROL-XL) 25 MG 24 hr tablet Take 25 mg by mouth daily.     valACYclovir (VALTREX) 1000 MG tablet Take 1 tablet by mouth daily.     No current facility-administered medications on file prior to visit.    Cardiovascular and other pertinent studies:  EKG 09/04/2021: Sinus rhythm 60 bpm  Left atrial enlargement  Possible old inferior infarct     Recent labs: 05/2021: BUN/Cr 11/0.9. EGFR 94 TSH 1.9 normal  07/2020: HbA1C 5.0% Chol 207, TG 123, HDL 51, LDL 134    Review of Systems  Cardiovascular:  Positive for chest pain (Jaw pain and lump in throat, during the episode of palpitations) and palpitations. Negative for dyspnea on exertion, leg swelling and syncope.        Vitals:   09/04/21 0849 09/04/21 0901  BP: (!) 167/103 (!) 163/98  Pulse: 71 64  Resp: 16   Temp: 98.2 F (36.8 C)  SpO2: 100% 100%     Body mass index is 39.79 kg/m. Filed Weights   09/04/21 0849  Weight: (!) 301 lb 9.6 oz (136.8 kg)     Objective:   Physical Exam Vitals and nursing note reviewed.  Constitutional:      General: He is not in acute distress. Neck:     Vascular: No JVD.  Cardiovascular:     Rate and Rhythm: Normal rate and regular rhythm.     Heart sounds: Normal heart sounds. No murmur heard. Pulmonary:     Effort: Pulmonary effort is normal.     Breath sounds: Normal breath sounds. No wheezing or rales.  Musculoskeletal:     Right lower leg: No edema.     Left lower leg: No edema.        Assessment & Recommendations:    55 y.o. Caucasian male with hypertension, mixed hyperlipidemia, referred for evaluation of tachycardia  Tachycardia: Symptoms and apple watch tracings are suggestive of paroxysmal  atrial fibrillation.  During his episodes of atrial fibrillation, I am concerned with angina symptoms.  Recommend exercise nuclear stress test, echocardiogram, CT cardiac scoring.  I have strong suspicion for obstructive sleep apnea, therefore referred for sleep study.  Continue metoprolol succinate 25 mg daily.  Started lisinopril 10 mg daily given hypertension and probable atrial fibrillation.  Based on monitor findings of degree of A. fib burden, and presence or absence of coronary artery disease, will discuss antiarrhythmic therapy versus ablation for rhythm control management.  CHA2DS2-VASc or 1, annual stroke risk 0.6%.  Do not recommend anticoagulation.  However, given suspicion for anginal symptoms, recommend aspirin 81 mg daily.  Mixed hyperlipidemia: Discussed diet and lifestyle modifications.   Ideally, recommend statin.  Patient wants to hold off at this time.  Will address at subsequent visits.   Thank you for referring the patient to [place]. Please feel free to contact with any questions.   Nigel Mormon, MD Pager: [phone] Office: [phone]

## 2021-09-04 ENCOUNTER — Ambulatory Visit: Payer: No Typology Code available for payment source | Admitting: Cardiology

## 2021-09-04 ENCOUNTER — Encounter: Payer: Self-pay | Admitting: Cardiology

## 2021-09-04 ENCOUNTER — Other Ambulatory Visit: Payer: Self-pay

## 2021-09-04 VITALS — BP 163/98 | HR 64 | Temp 98.2°F | Resp 16 | Ht 73.0 in | Wt 301.6 lb

## 2021-09-04 DIAGNOSIS — I10 Essential (primary) hypertension: Secondary | ICD-10-CM

## 2021-09-04 DIAGNOSIS — I209 Angina pectoris, unspecified: Secondary | ICD-10-CM

## 2021-09-04 DIAGNOSIS — R0683 Snoring: Secondary | ICD-10-CM

## 2021-09-04 DIAGNOSIS — I480 Paroxysmal atrial fibrillation: Secondary | ICD-10-CM

## 2021-09-04 DIAGNOSIS — E782 Mixed hyperlipidemia: Secondary | ICD-10-CM

## 2021-09-04 DIAGNOSIS — R000 Tachycardia, unspecified: Secondary | ICD-10-CM

## 2021-09-04 MED ORDER — ROSUVASTATIN CALCIUM 20 MG PO TABS: 20.0000 mg | ORAL_TABLET | Freq: Every day | ORAL | 3 refills | Status: DC

## 2021-09-04 MED ORDER — NITROGLYCERIN 0.4 MG SL SUBL
0.4000 mg | SUBLINGUAL_TABLET | SUBLINGUAL | 3 refills | Status: DC | PRN
Start: 2021-09-04 — End: 2022-12-12

## 2021-09-04 MED ORDER — LISINOPRIL 20 MG PO TABS: 20.0000 mg | ORAL_TABLET | Freq: Every day | ORAL | 3 refills | Status: DC

## 2021-09-04 MED ORDER — LISINOPRIL 10 MG PO TABS: 10.0000 mg | ORAL_TABLET | Freq: Every day | ORAL | 3 refills | Status: DC

## 2021-09-04 MED ORDER — DILTIAZEM HCL 30 MG PO TABS: 30.0000 mg | ORAL_TABLET | Freq: Three times a day (TID) | ORAL | 1 refills | Status: DC | PRN

## 2021-09-05 DIAGNOSIS — I4719 Other supraventricular tachycardia: Secondary | ICD-10-CM

## 2021-09-05 DIAGNOSIS — I471 Supraventricular tachycardia: Secondary | ICD-10-CM

## 2021-09-05 HISTORY — DX: Other supraventricular tachycardia: I47.19

## 2021-09-05 HISTORY — DX: Supraventricular tachycardia: I47.1

## 2021-09-05 HISTORY — PX: OTHER SURGICAL HISTORY: SHX169

## 2021-09-06 ENCOUNTER — Telehealth: Payer: Self-pay

## 2021-09-07 LAB — CBC
Hematocrit: 51 % (ref 37.5–51.0)
Hemoglobin: 16.9 g/dL (ref 13.0–17.7)
MCH: 29.9 pg (ref 26.6–33.0)
MCHC: 33.1 g/dL (ref 31.5–35.7)
MCV: 90 fL (ref 79–97)
Platelets: 231 10*3/uL (ref 150–450)
RBC: 5.65 x10E6/uL (ref 4.14–5.80)
RDW: 12.3 % (ref 11.6–15.4)
WBC: 5.6 10*3/uL (ref 3.4–10.8)

## 2021-09-07 LAB — BASIC METABOLIC PANEL
BUN/Creatinine Ratio: 13 (ref 9–20)
BUN: 13 mg/dL (ref 6–24)
CO2: 24 mmol/L (ref 20–29)
Calcium: 9 mg/dL (ref 8.7–10.2)
Chloride: 103 mmol/L (ref 96–106)
Creatinine, Ser: 1.01 mg/dL (ref 0.76–1.27)
Glucose: 103 mg/dL — ABNORMAL HIGH (ref 70–99)
Potassium: 4.6 mmol/L (ref 3.5–5.2)
Sodium: 140 mmol/L (ref 134–144)
eGFR: 87 mL/min/{1.73_m2} (ref >59)

## 2021-09-07 LAB — LIPID PANEL
Chol/HDL Ratio: 4.3 ratio (ref 0.0–5.0)
Cholesterol, Total: 198 mg/dL (ref 100–199)
HDL: 46 mg/dL (ref >39)
LDL Chol Calc (NIH): 123 mg/dL — ABNORMAL HIGH (ref 0–99)
Triglycerides: 161 mg/dL — ABNORMAL HIGH (ref 0–149)
VLDL Cholesterol Cal: 29 mg/dL (ref 5–40)

## 2021-09-11 ENCOUNTER — Inpatient Hospital Stay: Payer: No Typology Code available for payment source

## 2021-09-11 ENCOUNTER — Other Ambulatory Visit: Payer: No Typology Code available for payment source

## 2021-09-11 DIAGNOSIS — I480 Paroxysmal atrial fibrillation: Secondary | ICD-10-CM

## 2021-09-13 ENCOUNTER — Inpatient Hospital Stay: Payer: No Typology Code available for payment source

## 2021-09-13 ENCOUNTER — Ambulatory Visit: Payer: No Typology Code available for payment source

## 2021-09-13 ENCOUNTER — Other Ambulatory Visit: Payer: Self-pay

## 2021-09-13 ENCOUNTER — Other Ambulatory Visit: Payer: No Typology Code available for payment source

## 2021-09-13 DIAGNOSIS — I209 Angina pectoris, unspecified: Secondary | ICD-10-CM

## 2021-09-13 HISTORY — PX: TRANSTHORACIC ECHOCARDIOGRAM: SHX275

## 2021-09-25 ENCOUNTER — Ambulatory Visit
Admission: RE | Admit: 2021-09-25 | Discharge: 2021-09-25 | Disposition: A | Discharge status: Home / Self Care | Payer: No Typology Code available for payment source | Source: Ambulatory Visit | Attending: Cardiology | Admitting: Cardiology

## 2021-09-25 DIAGNOSIS — I209 Angina pectoris, unspecified: Secondary | ICD-10-CM

## 2021-09-27 ENCOUNTER — Other Ambulatory Visit: Payer: Self-pay

## 2021-09-27 ENCOUNTER — Ambulatory Visit: Payer: No Typology Code available for payment source

## 2021-09-27 DIAGNOSIS — I209 Angina pectoris, unspecified: Secondary | ICD-10-CM

## 2021-09-27 HISTORY — PX: NM GATED MYOCARDIAL STUDY (ARMX HX): HXRAD1849

## 2021-10-05 ENCOUNTER — Encounter: Payer: Self-pay | Admitting: Cardiology

## 2021-10-05 ENCOUNTER — Other Ambulatory Visit: Payer: Self-pay

## 2021-10-05 ENCOUNTER — Ambulatory Visit: Payer: No Typology Code available for payment source | Admitting: Cardiology

## 2021-10-05 VITALS — BP 141/86 | HR 66 | Temp 97.2°F | Ht 73.0 in | Wt 300.0 lb

## 2021-10-05 DIAGNOSIS — R0683 Snoring: Secondary | ICD-10-CM | POA: Insufficient documentation

## 2021-10-05 DIAGNOSIS — I480 Paroxysmal atrial fibrillation: Secondary | ICD-10-CM | POA: Insufficient documentation

## 2021-10-05 DIAGNOSIS — I10 Essential (primary) hypertension: Secondary | ICD-10-CM

## 2021-10-05 NOTE — Progress Notes (Signed)
Patient referred by [name], MD for tachycardia  Subjective:   Benjamin Paul, male    DOB: 02-26-1966, 55 y.o.   MRN: [id number]   Chief Complaint  Patient presents with   Atrial Fibrillation   Follow-up   Results    HPI  55 y.o. Caucasian male with hypertension, mixed hyperlipidemia, referred for evaluation of tachycardia  Patient has not had any significant palpitations episodes since last visit. Reviewed recent test results with the patient, details below.  Initial consultation visit 08/2021: Patient works a Network engineer job with maintenance.  He stays active with regular physical activity, including weight training as well as aerobic activity.  He denies any episodes of chest pain or shortness of breath, or tachycardia during his physical activity.  However, he has noticed episodes of rapid heart rate in 140s, captured on his apple watch, occurring for few minutes at a time.  Episodes generally occur in the evenings, while at rest.  During these episodes, he does report feelings of "lump in throat ", as well as jaw pain.  Patient's wife does endorses patient snoring at night.  He has never had a sleep study.  Blood pressure and cholesterol is elevated.   Current Outpatient Medications on File Prior to Visit  Medication Sig Dispense Refill   Cholecalciferol (HM VITAMIN D3) 100 MCG (4000 UT) CAPS Take by mouth daily.     cyclobenzaprine (FLEXERIL) 5 MG tablet Take 5 mg by mouth 3 (three) times daily as needed.      diltiazem (CARDIZEM) 30 MG tablet Take 1 tablet (30 mg total) by mouth 3 (three) times daily as needed. 60 tablet 1   lisinopril (ZESTRIL) 10 MG tablet Take 1 tablet (10 mg total) by mouth daily. 30 tablet 3   metoprolol succinate (TOPROL-XL) 25 MG 24 hr tablet Take 25 mg by mouth daily.     nitroGLYCERIN (NITROSTAT) 0.4 MG SL tablet Place 1 tablet (0.4 mg total) under the tongue every 5 (five) minutes as needed for chest pain. 30 tablet 3   valACYclovir (VALTREX)  1000 MG tablet Take 1 tablet by mouth daily.     No current facility-administered medications on file prior to visit.    Cardiovascular and other pertinent studies:  Mobile cardiac telemetry 11 days 09/13/2021 - 09/25/2021: Dominant rhythm: Sinus. HR 39--130 bpm. Avg HR 63=4 bpm, in sinus rhythm. 13 episodes of SVT, fastest at 197 bpm for 4 beats, longest for 20 beats at 104 bpm. <1% isolated SVE, couplet/triplets. 1 episode of VT, fastest at 187 bpm for 5 beats. <1% isolated VE, triplets. No atrial fibrillation/atrial flutter/high grade AV block, sinus pause >3sec noted. 1 patient triggered event correlated with SVE  Lexiscan Sestamibi stress test 09/27/2021: Lexiscan nuclear stress test performed using 1-day protocol. Normal myocardial perfusion.  Mild global decrease in myocardial thickening. Stress LVEF 45% Intermediate risk study.  Calcium score 0  Echocardiogram 09/13/2021:  Left ventricle cavity is normal in size. Mild concentric hypertrophy of  the left ventricle. Normal global wall motion. Normal LV systolic function  with visual EF 63%. Normal diastolic filling pattern.  Left atrial cavity is borderline dilated.  Structurally normal trileaflet aortic valve. Trace aortic regurgitation.  No significant valvular abnormality.  IVC not seen.   EKG 09/04/2021: Sinus rhythm 60 bpm  Left atrial enlargement  Possible old inferior infarct     Recent labs: 05/2021: BUN/Cr 11/0.9. EGFR 94 TSH 1.9 normal  07/2020: HbA1C 5.0% Chol 207, TG 123, HDL 51, LDL  134    Review of Systems  Cardiovascular:  Positive for chest pain (Occasional reproducible chest pain). Negative for dyspnea on exertion, leg swelling, palpitations and syncope.        Vitals:   10/05/21 0848  BP: (!) 141/86  Pulse: 66  Temp: (!) 97.2 F (36.2 C)  SpO2: 99%     Body mass index is 39.58 kg/m. Filed Weights   10/05/21 0848  Weight: 300 lb (136.1 kg)     Objective:   Physical  Exam Vitals and nursing note reviewed.  Constitutional:      General: He is not in acute distress. Neck:     Vascular: No JVD.  Cardiovascular:     Rate and Rhythm: Normal rate and regular rhythm.     Heart sounds: Normal heart sounds. No murmur heard. Pulmonary:     Effort: Pulmonary effort is normal.     Breath sounds: Normal breath sounds. No wheezing or rales.  Musculoskeletal:     Right lower leg: No edema.     Left lower leg: No edema.        Assessment & Recommendations:    55 y.o. Caucasian male with hypertension, mixed hyperlipidemia, paroxysmal Afib  Paroxysmal Afib: No recent recurrence.  Recommend weight loss, sleep study, hypertension control. Occasional PSVT and NSVT on monitor, but no A. fib. Structurally normal heart, calcium score 0.  Mildly reduced stress LVEF without any ischemia. Given only occasional A. fib episodes, continue rate control for now with metoprolol succinate 25 mg daily, and diltiazem 30 mg as needed. Encouraged to increase physical activity and undergo sleep study. CHA2DS2-VASc or 1, annual stroke risk 0.6%.  Do not recommend anticoagulation.    Mixed hyperlipidemia: Discussed diet and lifestyle modifications.   Ideally, recommend statin.  Patient wants to hold off at this time.  Will address at subsequent visits.  F/u in 6 months   Nigel Mormon, MD Pager: [phone] Office: [phone]

## 2021-12-24 ENCOUNTER — Other Ambulatory Visit: Payer: Self-pay | Admitting: Cardiology

## 2021-12-24 DIAGNOSIS — I10 Essential (primary) hypertension: Secondary | ICD-10-CM

## 2022-01-02 NOTE — Progress Notes (Addendum)
? ? ?Patient referred by Benjamin Lass, MD for tachycardia ? ?Subjective:  ? ?Benjamin Paul, male    DOB: 03/24/1966, 56 y.o.   MRN: [id number] ? ? ?Chief Complaint  ?Patient presents with  ? PAF  ?  3 month  ? ? ?HPI ? ?56 y.o. Caucasian male with hypertension, mixed hyperlipidemia, paroxysmal Afib ? ?Patient is here with his wife today. He has only 3-4 episodes of rapid heart beat, lasting for <1 hour, usually occur at rest between 6 pm-midnight. He has not seen any Afib alert on his Apple watch. He is frustrated with episodes that seemed to only occur at rest, and fatigue that limits his ability to walk as much as he wants to. He denies any chest pain or shortness of breath symptoms. Fatigue has started since being on metoprolol. He wants to lose weight, but is limited in his ability to exercise more than his 2 mile walks, due to fatigue.  ? ?On a separate note, blood pressure is elevated today.  Usually well controlled.  Today, he had stress related to certain medical issues in the family. ? ?Initial consultation visit 08/2021: ?Patient works a Network engineer job with maintenance.  He stays active with regular physical activity, including weight training as well as aerobic activity.  He denies any episodes of chest pain or shortness of breath, or tachycardia during his physical activity.  However, he has noticed episodes of rapid heart rate in 140s, captured on his apple watch, occurring for few minutes at a time.  Episodes generally occur in the evenings, while at rest.  During these episodes, he does report feelings of "lump in throat ", as well as jaw pain. ? ?Patient's wife does endorses patient snoring at night.  He has never had a sleep study.  Blood pressure and cholesterol is elevated. ? ? ?Current Outpatient Medications on File Prior to Visit  ?Medication Sig Dispense Refill  ? Cholecalciferol (HM VITAMIN D3) 100 MCG (4000 UT) CAPS Take by mouth daily.    ? cyclobenzaprine (FLEXERIL) 5 MG tablet Take 5 mg by mouth  3 (three) times daily as needed.     ? diltiazem (CARDIZEM) 30 MG tablet Take 1 tablet (30 mg total) by mouth 3 (three) times daily as needed. 60 tablet 1  ? lisinopril (ZESTRIL) 10 MG tablet TAKE 1 TABLET BY MOUTH EVERY DAY 30 tablet 2  ? metoprolol succinate (TOPROL-XL) 25 MG 24 hr tablet Take 25 mg by mouth daily.    ? nitroGLYCERIN (NITROSTAT) 0.4 MG SL tablet Place 1 tablet (0.4 mg total) under the tongue every 5 (five) minutes as needed for chest pain. 30 tablet 3  ? valACYclovir (VALTREX) 1000 MG tablet Take 1 tablet by mouth daily.    ? ?No current facility-administered medications on file prior to visit.  ? ? ?Cardiovascular and other pertinent studies: ? ?EKG 01/03/2022: ?Sinus rhythm 62 bpm ?Nonspecific T wave inversion inferior leads  ? ?Mobile cardiac telemetry 11 days 09/13/2021 - 09/25/2021: ?Dominant rhythm: Sinus. ?HR 39--130 bpm. Avg HR 63=4 bpm, in sinus rhythm. ?13 episodes of SVT, fastest at 197 bpm for 4 beats, longest for 20 beats at 104 bpm. ?<1% isolated SVE, couplet/triplets. ?1 episode of VT, fastest at 187 bpm for 5 beats. ?<1% isolated VE, triplets. ?No atrial fibrillation/atrial flutter/high grade AV block, sinus pause >3sec noted. ?1 patient triggered event correlated with SVE ? ?Lexiscan Sestamibi stress test 09/27/2021: ?Lexiscan nuclear stress test performed using 1-day protocol. ?Normal myocardial perfusion.  ?Mild  global decrease in myocardial thickening. Stress LVEF 45% ?Intermediate risk study. ? ?Calcium score 0 ? ?Echocardiogram 09/13/2021:  ?Left ventricle cavity is normal in size. Mild concentric hypertrophy of  ?the left ventricle. Normal global wall motion. Normal LV systolic function  ?with visual EF 63%. Normal diastolic filling pattern.  ?Left atrial cavity is borderline dilated.  ?Structurally normal trileaflet aortic valve. Trace aortic regurgitation.  ?No significant valvular abnormality.  ?IVC not seen. ?  ?EKG 09/04/2021: ?Sinus rhythm 60 bpm  ?Left atrial enlargement   ?Possible old inferior infarct ? ? ? ? ?Recent labs: ?09/06/2021: ?Glucose 103, BUN/Cr 13/1.01. EGFR 87. Na/K 140/4.6.  ?H/H 16/51. MCV 90. Platelets 231 ?Chol 198, TG 161, HDL 46, LDL 123 ? ?05/2021: ?BUN/Cr 11/0.9. EGFR 94 ?TSH 1.9 normal ? ?07/2020: ?HbA1C 5.0% ?Chol 207, TG 123, HDL 51, LDL 134 ? ? ? ?Review of Systems  ?Cardiovascular:  Positive for chest pain (Occasional reproducible chest pain). Negative for dyspnea on exertion, leg swelling, palpitations and syncope.  ? ?   ? ? ?Vitals:  ? 01/03/22 0829 01/03/22 0839  ?BP: (!) 161/101 (!) 152/103  ?Pulse: 68 64  ?Resp: 17   ?Temp: 97.9 ?F (36.6 ?C)   ?SpO2: 100% 100%  ? ? ? ?Body mass index is 39.9 kg/m?. Danley Danker Weights  ? 01/03/22 0829  ?Weight: (!) 302 lb 6.4 oz (137.2 kg)  ? ? ? ?Objective:  ? Physical Exam ?Vitals and nursing note reviewed.  ?Constitutional:   ?   General: He is not in acute distress. ?Neck:  ?   Vascular: No JVD.  ?Cardiovascular:  ?   Rate and Rhythm: Normal rate and regular rhythm.  ?   Heart sounds: Normal heart sounds. No murmur heard. ?Pulmonary:  ?   Effort: Pulmonary effort is normal.  ?   Breath sounds: Normal breath sounds. No wheezing or rales.  ?Musculoskeletal:  ?   Right lower leg: No edema.  ?   Left lower leg: No edema.  ? ? ?  ?  ICD-10-CM   ?1. PAF (paroxysmal atrial fibrillation) (HCC)  I48.0 EKG 12-Lead  ?  diltiazem (CARDIZEM CD) 120 MG 24 hr capsule  ?  ?2. Mixed hyperlipidemia  E78.2   ?  ?3. Essential hypertension  I10 diltiazem (CARDIZEM CD) 120 MG 24 hr capsule  ?  ?4. Class 2 severe obesity with serious comorbidity and body mass index (BMI) of 39.0 to 39.9 in adult, unspecified obesity type ([place])  E66.01   ? Z68.39   ?  ? ?Medications Discontinued During This Encounter  ?Medication Reason  ? metoprolol succinate (TOPROL-XL) 25 MG 24 hr tablet Side effect (s)  ? ? ? ?Meds ordered this encounter  ?Medications  ? diltiazem (CARDIZEM CD) 120 MG 24 hr capsule  ?  Sig: Take 1 capsule (120 mg total) by mouth daily.  ?   Dispense:  90 capsule  ?  Refill:  3  ? ? ? ?Assessment & Recommendations:  ? ? ?56 y.o. Caucasian male with hypertension, mixed hyperlipidemia, paroxysmal Afib ? ?Paroxysmal Afib: ?No documented recurrence of A-fib. ?Enrolled his Apple watch in cardiologs monitoring ?Fatigue most likely due to metoprolol, in absence of any obvious OSA symptoms. ?Change metoprolol succinate from 100 g daily to diltiazem 120 mg daily. ?CHA2DS2-VASc or 1, annual stroke risk 0.6%.  Do not recommend anticoagulation.   ? ?Obesity is one of the most significant risk factors for his paroxysmal A-fib.  Obesity is likely also contributing to hypertension.  I  do think he will benefit from weight loss.  Currently, his ability to exercise more is limited due to fatigue.  I do think he would benefit from weight loss medication such as semaglutide. Unfortunately, I do not have any personal experience prescribing this medication. Patient is going to discuss this further with his PCP Dr. Kathyrn Paul next week. ? ?Mixed hyperlipidemia: ?LDL 134, HDL 51, total chol 207 ?10 yr ASCVD risk 10.4%. Calcium score 0 ?He wants to avoid statin at this time. ?Discussed diet and lifestyle modifications.   ? ?Hypertension: ?Generally well controlled on lisinopril 10 mg daily, previously on metoprolol succinate 25 mg daily not being switched to diltiazem 120 mg daily. ?Continue regular home checks.  No change made today. ? ?F/u in 3 months ? ? ?Nigel Mormon, MD ?Pager: [phone] ?Office: [phone] ? ?

## 2022-01-03 ENCOUNTER — Encounter: Payer: Self-pay | Admitting: Cardiology

## 2022-01-03 ENCOUNTER — Ambulatory Visit: Payer: No Typology Code available for payment source | Admitting: Cardiology

## 2022-01-03 ENCOUNTER — Other Ambulatory Visit: Payer: Self-pay

## 2022-01-03 VITALS — BP 152/103 | HR 64 | Temp 97.9°F | Resp 17 | Ht 73.0 in | Wt 302.4 lb

## 2022-01-03 DIAGNOSIS — Z6839 Body mass index (BMI) 39.0-39.9, adult: Secondary | ICD-10-CM

## 2022-01-03 DIAGNOSIS — I480 Paroxysmal atrial fibrillation: Secondary | ICD-10-CM

## 2022-01-03 DIAGNOSIS — E661 Drug-induced obesity: Secondary | ICD-10-CM | POA: Insufficient documentation

## 2022-01-03 DIAGNOSIS — E66811 Body mass index (BMI) 33.0-33.9, adult: Secondary | ICD-10-CM | POA: Insufficient documentation

## 2022-01-03 DIAGNOSIS — I10 Essential (primary) hypertension: Secondary | ICD-10-CM

## 2022-01-03 DIAGNOSIS — E782 Mixed hyperlipidemia: Secondary | ICD-10-CM

## 2022-01-03 MED ORDER — DILTIAZEM HCL ER COATED BEADS 120 MG PO CP24: 120.0000 mg | ORAL_CAPSULE | Freq: Every day | ORAL | 3 refills | Status: DC

## 2022-01-07 ENCOUNTER — Encounter: Payer: Self-pay | Admitting: Cardiology

## 2022-01-08 ENCOUNTER — Encounter: Payer: Self-pay | Admitting: Cardiology

## 2022-01-08 NOTE — Telephone Encounter (Signed)
From patient.

## 2022-03-13 ENCOUNTER — Telehealth: Payer: Self-pay

## 2022-03-13 NOTE — Telephone Encounter (Signed)
Will send  new link when I get back to office. Please remind me. ? ?Thanks ?MJP ? ?

## 2022-03-25 ENCOUNTER — Other Ambulatory Visit: Payer: Self-pay | Admitting: Cardiology

## 2022-03-25 DIAGNOSIS — I10 Essential (primary) hypertension: Secondary | ICD-10-CM

## 2022-03-28 NOTE — Telephone Encounter (Signed)
Patient stated that his app was working again, but email address is :   [email or web address]

## 2022-03-28 NOTE — Telephone Encounter (Signed)
Tried calling patient for e-mail no answer left a vm to call back

## 2022-03-28 NOTE — Telephone Encounter (Signed)
Dr. Fraser Din just a reminder to send a new link to patient

## 2022-03-28 NOTE — Telephone Encounter (Signed)
Can you get the email address from him please?  Thanks MJP

## 2022-04-05 ENCOUNTER — Ambulatory Visit: Payer: No Typology Code available for payment source | Admitting: Cardiology

## 2022-04-05 NOTE — Progress Notes (Unsigned)
Patient referred by [name], MD for tachycardia  Subjective:   Benjamin Paul, male    DOB: 03/10/1966, 56 y.o.   MRN: [id number]   No chief complaint on file.   HPI  56 y.o. Caucasian male with hypertension, mixed hyperlipidemia, paroxysmal Afib  ***Patient is here with his wife today. He has only 3-4 episodes of rapid heart beat, lasting for <1 hour, usually occur at rest between 6 pm-midnight. He has not seen any Afib alert on his Apple watch. He is frustrated with episodes that seemed to only occur at rest, and fatigue that limits his ability to walk as much as he wants to. He denies any chest pain or shortness of breath symptoms. Fatigue has started since being on metoprolol. He wants to lose weight, but is limited in his ability to exercise more than his 2 mile walks, due to fatigue.   On a separate note, blood pressure is elevated today.  Usually well controlled.  Today, he had stress related to certain medical issues in the family.  Initial consultation visit 08/2021: Patient works a Network engineer job with maintenance.  He stays active with regular physical activity, including weight training as well as aerobic activity.  He denies any episodes of chest pain or shortness of breath, or tachycardia during his physical activity.  However, he has noticed episodes of rapid heart rate in 140s, captured on his apple watch, occurring for few minutes at a time.  Episodes generally occur in the evenings, while at rest.  During these episodes, he does report feelings of "lump in throat ", as well as jaw pain.  Patient's wife does endorses patient snoring at night.  He has never had a sleep study.  Blood pressure and cholesterol is elevated.   Current Outpatient Medications:    Cholecalciferol (HM VITAMIN D3) 100 MCG (4000 UT) CAPS, Take by mouth daily., Disp: , Rfl:    cyclobenzaprine (FLEXERIL) 5 MG tablet, Take 5 mg by mouth 3 (three) times daily as needed. , Disp: , Rfl:    diltiazem  (CARDIZEM CD) 120 MG 24 hr capsule, Take 1 capsule (120 mg total) by mouth daily., Disp: 90 capsule, Rfl: 3   diltiazem (CARDIZEM) 30 MG tablet, Take 1 tablet (30 mg total) by mouth 3 (three) times daily as needed., Disp: 60 tablet, Rfl: 1   lisinopril (ZESTRIL) 10 MG tablet, TAKE 1 TABLET BY MOUTH EVERY DAY, Disp: 90 tablet, Rfl: 3   nitroGLYCERIN (NITROSTAT) 0.4 MG SL tablet, Place 1 tablet (0.4 mg total) under the tongue every 5 (five) minutes as needed for chest pain., Disp: 30 tablet, Rfl: 3   valACYclovir (VALTREX) 1000 MG tablet, Take 1 tablet by mouth daily., Disp: , Rfl:     Cardiovascular and other pertinent studies:  EKG 01/03/2022: Sinus rhythm 62 bpm Nonspecific T wave inversion inferior leads   Mobile cardiac telemetry 11 days 09/13/2021 - 09/25/2021: Dominant rhythm: Sinus. HR 39--130 bpm. Avg HR 63=4 bpm, in sinus rhythm. 13 episodes of SVT, fastest at 197 bpm for 4 beats, longest for 20 beats at 104 bpm. <1% isolated SVE, couplet/triplets. 1 episode of VT, fastest at 187 bpm for 5 beats. <1% isolated VE, triplets. No atrial fibrillation/atrial flutter/high grade AV block, sinus pause >3sec noted. 1 patient triggered event correlated with SVE  Lexiscan Sestamibi stress test 09/27/2021: Lexiscan nuclear stress test performed using 1-day protocol. Normal myocardial perfusion.  Mild global decrease in myocardial thickening. Stress LVEF 45% Intermediate risk study.  Calcium score 0  Echocardiogram 09/13/2021:  Left ventricle cavity is normal in size. Mild concentric hypertrophy of  the left ventricle. Normal global wall motion. Normal LV systolic function  with visual EF 63%. Normal diastolic filling pattern.  Left atrial cavity is borderline dilated.  Structurally normal trileaflet aortic valve. Trace aortic regurgitation.  No significant valvular abnormality.  IVC not seen.   EKG 09/04/2021: Sinus rhythm 60 bpm  Left atrial enlargement  Possible old inferior  infarct     Recent labs: 09/06/2021: Glucose 103, BUN/Cr 13/1.01. EGFR 87. Na/K 140/4.6.  H/H 16/51. MCV 90. Platelets 231 Chol 198, TG 161, HDL 46, LDL 123  05/2021: BUN/Cr 11/0.9. EGFR 94 TSH 1.9 normal  07/2020: HbA1C 5.0% Chol 207, TG 123, HDL 51, LDL 134    Review of Systems  Cardiovascular:  Positive for chest pain (Occasional reproducible chest pain). Negative for dyspnea on exertion, leg swelling, palpitations and syncope.        There were no vitals filed for this visit.    There is no height or weight on file to calculate BMI. There were no vitals filed for this visit.    Objective:   Physical Exam Vitals and nursing note reviewed.  Constitutional:      General: He is not in acute distress. Neck:     Vascular: No JVD.  Cardiovascular:     Rate and Rhythm: Normal rate and regular rhythm.     Heart sounds: Normal heart sounds. No murmur heard. Pulmonary:     Effort: Pulmonary effort is normal.     Breath sounds: Normal breath sounds. No wheezing or rales.  Musculoskeletal:     Right lower leg: No edema.     Left lower leg: No edema.      No diagnosis found.  There are no discontinued medications.    No orders of the defined types were placed in this encounter.    Assessment & Recommendations:    56 y.o. Caucasian male with hypertension, mixed hyperlipidemia, paroxysmal Afib  ***Paroxysmal Afib: No documented recurrence of A-fib. No conclusive evidence of Afib on Cardiologs monitoring Fatigue most likely due to metoprolol, in absence of any obvious OSA symptoms. Changed metoprolol succinate from 100 g daily to diltiazem 120 mg daily due to fatigue in 01/2022. CHA2DS2-VASc or 1, annual stroke risk 0.6%.  Do not recommend anticoagulation.    ***Obesity is one of the most significant risk factors for his paroxysmal A-fib.  Obesity is likely also contributing to hypertension.  I do think he will benefit from weight loss.  Currently, his  ability to exercise more is limited due to fatigue.  I do think he would benefit from weight loss medication such as semaglutide. Unfortunately, I do not have any personal experience prescribing this medication. Patient is going to discuss this further with his PCP Dr. [name] next week.  ***Mixed hyperlipidemia: LDL 134, HDL 51, total chol 207 10 yr ASCVD risk 10.4%. Calcium score 0 He wants to avoid statin at this time. Discussed diet and lifestyle modifications.    ***Hypertension: Generally well controlled on lisinopril 10 mg daily, previously on metoprolol succinate 25 mg daily not being switched to diltiazem 120 mg daily. Continue regular home checks.  No change made today.  F/u in 3 months   Nigel Mormon, MD Pager: [phone] Office: [phone]

## 2022-04-06 ENCOUNTER — Encounter: Payer: Self-pay | Admitting: Cardiology

## 2022-04-06 ENCOUNTER — Ambulatory Visit: Payer: No Typology Code available for payment source | Admitting: Cardiology

## 2022-04-06 VITALS — BP 134/83 | HR 85 | Temp 98.6°F | Resp 16 | Ht 73.0 in | Wt 287.0 lb

## 2022-04-06 DIAGNOSIS — I10 Essential (primary) hypertension: Secondary | ICD-10-CM

## 2022-04-06 DIAGNOSIS — E782 Mixed hyperlipidemia: Secondary | ICD-10-CM

## 2022-04-06 DIAGNOSIS — I480 Paroxysmal atrial fibrillation: Secondary | ICD-10-CM

## 2022-04-10 ENCOUNTER — Telehealth: Payer: Self-pay

## 2022-04-10 NOTE — Telephone Encounter (Signed)
Notes scanned to referral 

## 2022-04-20 ENCOUNTER — Encounter: Payer: Self-pay | Admitting: Cardiology

## 2022-04-20 ENCOUNTER — Ambulatory Visit (INDEPENDENT_AMBULATORY_CARE_PROVIDER_SITE_OTHER): Payer: No Typology Code available for payment source | Admitting: Cardiology

## 2022-04-20 VITALS — BP 138/85 | HR 60 | Ht 73.0 in | Wt 281.8 lb

## 2022-04-20 DIAGNOSIS — I480 Paroxysmal atrial fibrillation: Secondary | ICD-10-CM | POA: Diagnosis not present

## 2022-04-20 DIAGNOSIS — I10 Essential (primary) hypertension: Secondary | ICD-10-CM

## 2022-04-20 DIAGNOSIS — E782 Mixed hyperlipidemia: Secondary | ICD-10-CM

## 2022-04-20 DIAGNOSIS — R000 Tachycardia, unspecified: Secondary | ICD-10-CM

## 2022-04-20 DIAGNOSIS — I471 Supraventricular tachycardia: Secondary | ICD-10-CM | POA: Diagnosis not present

## 2022-04-20 DIAGNOSIS — Z6839 Body mass index (BMI) 39.0-39.9, adult: Secondary | ICD-10-CM

## 2022-04-20 NOTE — Progress Notes (Unsigned)
Primary Care Provider: Sigmund Hazel, MD [facility] Cardiologist: None -Active Cardiologist: Dr.Patwardhan Electrophysiologist: None  Clinic Note: No chief complaint on file.   ===================================  ASSESSMENT/PLAN   Problem List Items Addressed This Visit   None  ===================================  HPI:    Benjamin Paul is a 56 y.o. male notable for relatively recently diagnosed PAF (October 2022) who is being seen today for the evaluation of *** at the request of Sigmund Hazel, MD.  He was seen in initial consultation by Dr. Rosemary Holms in October 2022 for occasional episodes of rapid heart rate but unable to actually confirm A-fib on the Apple Watch.  Noted he was very active exercising in the [place] [facility] and lose weight.  Does weight training and aerobic activities.  No chest pain or pressure or dyspnea.  No sensation of tachycardia.  However at rest he has episodes of heart rates in the 140s and his Apple Watch these are associated with a sensation of a "lump in his throat or jaw pain.  Also noted the possibility of snoring but no sleep study.  Benjamin Paul was last seen on April 06, 2022 by Dr. Rosemary Holms -> no documented recurrence of A-fib or conclusive evidence of A-fib on any monitoring techniques.  Thought to have fatigue related to metoprolol succinate 100 mg daily and was changed to diltiazem 120 mg daily due to fatigue this resolved symptoms. Not on DOAC because of CHA2DS2-VASc 4 (1.  Recent Hospitalizations: ***  Reviewed  CV studies:    The following studies were reviewed today: (if available, images/films reviewed: From Epic Chart or Care Everywhere) Mobile cardiac telemetry 11 days 09/13/2021 - 09/25/2021: Dominant rhythm: Sinus. HR 39--130 bpm. Avg HR 63=4 bpm, in sinus rhythm. 13 episodes of SVT, fastest at 197 bpm for 4 beats, longest for 20 beats at 104 bpm. <1% isolated SVE, couplet/triplets. 1 episode of VT, fastest at 187  bpm for 5 beats. <1% isolated VE, triplets. No atrial fibrillation/atrial flutter/high grade AV block, sinus pause >3sec noted. 1 patient triggered event correlated with SVE   Lexiscan Sestamibi stress test 09/27/2021: Lexiscan nuclear stress test performed using 1-day protocol. Normal myocardial perfusion.  Mild global decrease in myocardial thickening. Stress LVEF 45% Intermediate risk study.   Calcium score 0   Echocardiogram 09/13/2021:  Left ventricle cavity is normal in size. Mild concentric hypertrophy of  the left ventricle. Normal global wall motion. Normal LV systolic function  with visual EF 63%. Normal diastolic filling pattern.  Left atrial cavity is borderline dilated.  Structurally normal trileaflet aortic valve. Trace aortic regurgitation.  No significant valvular abnormality.  IVC not seen.:    EKG 09/04/2021: Sinus rhythm 60 bpm  Left atrial enlargement  Possible old inferior infarct       Interval History:   Benjamin Paul   CV Review of Symptoms (Summary) Cardiovascular ROS: {roscv:310661}  REVIEWED OF SYSTEMS   ROS  I have reviewed and (if needed) personally updated the patient's problem list, medications, allergies, past medical and surgical history, social and family history.   PAST MEDICAL HISTORY   Past Medical History:  Diagnosis Date   Abdominal pain    Achilles tendinitis of left lower extremity 06/08/2011   DISC DISEASE, LUMBAR 12/13/2008   HYPERLIPIDEMIA 12/13/2008   HYPERTENSION 12/13/2008   NEPHROLITHIASIS, HX OF 12/13/2008    PAST SURGICAL HISTORY   Past Surgical History:  Procedure Laterality Date   ROTATOR CUFF REPAIR     Dr. Priscille Kluver  TOE SURGERY Left    MOLE REMOVAL    Immunization History  Administered Date(s) Administered   Td 12/13/2008    MEDICATIONS/ALLERGIES   Current Meds  Medication Sig   Cholecalciferol (HM VITAMIN D3) 100 MCG (4000 UT) CAPS Take by mouth daily.   cyclobenzaprine (FLEXERIL) 5 MG tablet Take  5 mg by mouth 3 (three) times daily as needed.    diltiazem (CARDIZEM CD) 120 MG 24 hr capsule Take 1 capsule (120 mg total) by mouth daily.   diltiazem (CARDIZEM) 30 MG tablet Take 1 tablet (30 mg total) by mouth 3 (three) times daily as needed.   lisinopril (ZESTRIL) 10 MG tablet TAKE 1 TABLET BY MOUTH EVERY DAY   nitroGLYCERIN (NITROSTAT) 0.4 MG SL tablet Place 1 tablet (0.4 mg total) under the tongue every 5 (five) minutes as needed for chest pain.   valACYclovir (VALTREX) 1000 MG tablet Take 1 tablet by mouth daily.    No Known Allergies  SOCIAL HISTORY/FAMILY HISTORY   Reviewed in Epic:   Social History   Tobacco Use   Smoking status: Never   Smokeless tobacco: Never  Vaping Use   Vaping Use: Never used  Substance Use Topics   Alcohol use: Yes    Comment: rare   Drug use: No   Social History   Social History Narrative   Not on file   Family History  Problem Relation Age of Onset   Breast cancer Mother    Hypertension Mother    Atrial fibrillation Mother    Gallbladder disease Mother    Heart attack Father 86   Hyperlipidemia Father    Heart disease Father    Hypertension Father    Stroke Father    Diabetes Father    Cancer Father    Breast cancer Sister 53   Diabetes Brother    Cancer Maternal Grandmother        ovarian   Hypertension Other    Cancer Other        breast cancer   Hypertension Other     OBJCTIVE -PE, EKG, labs   Wt Readings from Last 3 Encounters:  04/20/22 281 lb 12.8 oz (127.8 kg)  04/06/22 287 lb (130.2 kg)  01/03/22 (!) 302 lb 6.4 oz (137.2 kg)    Physical Exam: BP 138/85   Pulse 60   Ht 6\' 1"  (1.854 m)   Wt 281 lb 12.8 oz (127.8 kg)   SpO2 98%   BMI 37.18 kg/m  Physical Exam   Adult ECG Report  Rate: *** ;  Rhythm: {rhythm:17366};   Narrative Interpretation: ***  Recent Labs: Has a desire to avoid statins.   Lab Results  Component Value Date   CHOL 198 09/06/2021   HDL 46 09/06/2021   LDLCALC 123 (H)  09/06/2021   TRIG 161 (H) 09/06/2021   CHOLHDL 4.3 09/06/2021   Lab Results  Component Value Date   CREATININE 1.01 09/06/2021   BUN 13 09/06/2021   NA 140 09/06/2021   K 4.6 09/06/2021   CL 103 09/06/2021   CO2 24 09/06/2021      Latest Ref Rng & Units 09/06/2021    8:16 AM 09/01/2010    9:35 AM 01/11/2008   11:52 AM  CBC  WBC 3.4 - 10.8 x10E3/uL 5.6  6.8  7.3   Hemoglobin 13.0 - 17.7 g/dL 03/12/2008  85.2  77.8   Hematocrit 37.5 - 51.0 % 51.0  48.3  30.4   Platelets 150 - 450 x10E3/uL  231  224.0  630 RESULT REPEATED AND VERIFIED DELTA CHECK NOTED     No results found for: "HGBA1C" Lab Results  Component Value Date   TSH 1.36 09/01/2010    ==================================================  COVID-19 Education: The signs and symptoms of COVID-19 were discussed with the patient and how to seek care for testing (follow up with PCP or arrange E-visit).    I spent a total of *** minutes with the patient spent in direct patient consultation.  Additional time spent with chart review  / charting (studies, outside notes, etc): *** min Total Time: *** min  Current medicines are reviewed at length with the patient today.  (+/- concerns) ***  This visit occurred during the SARS-CoV-2 public health emergency.  Safety protocols were in place, including screening questions prior to the visit, additional usage of staff PPE, and extensive cleaning of exam room while observing appropriate contact time as indicated for disinfecting solutions.  Notice: This dictation was prepared with Dragon dictation along with smart phrase technology. Any transcriptional errors that result from this process are unintentional and may not be corrected upon review.   Studies Ordered:  No orders of the defined types were placed in this encounter.  No orders of the defined types were placed in this encounter.   Patient Instructions / Medication Changes & Studies & Tests Ordered   There are no Patient  Instructions on file for this visit.    Bryan Lemma, M.D., M.S. Interventional Cardiologist   Pager # [phone] Phone # [phone] [address] [place], [place] [address]   Thank you for choosing Heartcare at [facility]!!

## 2022-04-20 NOTE — Patient Instructions (Addendum)
Medication Instructions:   Continue taking Diltiazem  - try and take later in the evening  Do not be afraid to use extra dose of Diltiazem for break through  episode     *If you need a refill on your cardiac medications before your next appointment, please call your pharmacy*   Lab Work:  Not needed   Testing/Procedures:  Not needed  Follow-Up: At [facility], you and your health needs are our priority.  As part of our continuing mission to provide you with exceptional heart care, we have created designated Provider Care Teams.  These Care Teams include your primary Cardiologist (physician) and Advanced Practice Providers (APPs -  Physician Assistants and Nurse Practitioners) who all work together to provide you with the care you need, when you need it.     Your next appointment:   6 month(s)  The format for your next appointment:   In Person  Provider:   Dr Bryan Lemma

## 2022-04-22 ENCOUNTER — Encounter: Payer: Self-pay | Admitting: Cardiology

## 2022-04-22 DIAGNOSIS — R000 Tachycardia, unspecified: Secondary | ICD-10-CM | POA: Insufficient documentation

## 2022-04-22 DIAGNOSIS — I471 Supraventricular tachycardia: Secondary | ICD-10-CM | POA: Insufficient documentation

## 2022-04-22 DIAGNOSIS — I4719 Other supraventricular tachycardia: Secondary | ICD-10-CM | POA: Insufficient documentation

## 2022-04-22 NOTE — Assessment & Plan Note (Signed)
Doing well with weight loss.  Now below BMI 40 which is the cutoff for morbid obesity, but with other risk factors still consistent with morbid obesity.  He has done well with his weight loss plan.

## 2022-04-22 NOTE — Assessment & Plan Note (Signed)
Basically paroxysmal tachycardia whether it is PAF or PAT.  He is having these intermittent spells and I think a lot of it is also sinus tachycardia. Stressed the importance of adequate hydration, as such I would probably not use a diuretic for treating blood pressure.  With a resting heart of 60, unfortunately cannot increase his standing AV nodal agent any further.  He does have PRN agents.  There is a good likelihood that some of this could be stress/anxiety related.  As such, I spent a good amount of time trying to reassure him that the findings so far are benign.

## 2022-04-22 NOTE — Assessment & Plan Note (Signed)
Borderline blood pressures today.  He is alert anxious, but has had issues with blood pressure at his his other visits.  We hope this will improve with weight loss.  He is on 10 mg lisinopril plus the 120 of diltiazem.  I think there is room to titrate up lisinopril or consider addition of diuretic.

## 2022-04-22 NOTE — Assessment & Plan Note (Signed)
He only had 1 episode reviewed on Apple watch above that is suggestive of A-fib.  Monitor did not show any A-fib but did show frequent PAT episodes.  Certainly bursts of PAT are often harbingers of A-fib.  I agree with CHA2DS2-VASc score of 1 given his age and the infrequency of actually documented A-fib, that is not reasonable to start on DOAC.  He is on aspirin 81 mg daily.  Not listed on med list. Did not tolerate beta-blocker, is doing okay with diltiazem, I suggested he take it slightly later in the evening to get better coverage when he is going to sleep.  He should not be concerned about taking an additional dose if he has symptoms.   We may switch him the PRN medicine to propranolol or other short acting beta-blocker.  He has had a negative ischemic evaluation with a relatively normal echo.  The echo EF supersedes the echo on nuclear stress test..

## 2022-04-22 NOTE — Assessment & Plan Note (Signed)
Interesting that his monitor that he wore for 11 days only showed 13 episodes of PAT when he is starting his episodes of tachycardia just but every night.  He says they last a lot longer than 20 seconds.  I think he is probably having short little bursts intermittently and otherwise has sinus tachycardia.  He is on diltiazem which seems to be doing a pretty good job, and his resting heart rate is 60 bpm so he cannot go further.  He does have a as needed dosing of 30 mg short acting diltiazem which she is reluctant to use.  I spent a good bit of time reassuring him that these spells are not severe, they are benign findings that we see on lots of monitors.  His symptoms are likely benign and that he is just hyper aware of them because of his family history.  I reassured him about his other test results.  I actually think that having the Apple Watch is a hindrance to him at this point.  I encouraged him to continue to stay active.  If necessary we can add low-dose PRN beta-blocker as opposed to diltiazem. Told him to take his diltiazem later in the evening.

## 2022-04-22 NOTE — Assessment & Plan Note (Signed)
His LDL as of November 2022 was 123 and triglycerides 161.  This plus obesity and hypertension does meet criteria for metabolic syndrome.  Not currently on statin.  I think the Coronary Calcium Score of 0, we have time for continued dietary modification and exercise for weight loss and reassess.  Hopefully we can target an LDL close to 100.  We may end up needing a low-dose statin in the future but for now we will hold off.

## 2022-10-08 ENCOUNTER — Ambulatory Visit: Payer: No Typology Code available for payment source | Admitting: Cardiology

## 2022-10-22 ENCOUNTER — Ambulatory Visit: Payer: No Typology Code available for payment source | Attending: Cardiology | Admitting: Cardiology

## 2022-10-22 VITALS — BP 124/80 | HR 70 | Ht 73.0 in | Wt 239.0 lb

## 2022-10-22 DIAGNOSIS — E782 Mixed hyperlipidemia: Secondary | ICD-10-CM

## 2022-10-22 DIAGNOSIS — I480 Paroxysmal atrial fibrillation: Secondary | ICD-10-CM

## 2022-10-22 DIAGNOSIS — I4719 Other supraventricular tachycardia: Secondary | ICD-10-CM | POA: Diagnosis not present

## 2022-10-22 DIAGNOSIS — R000 Tachycardia, unspecified: Secondary | ICD-10-CM

## 2022-10-22 DIAGNOSIS — I10 Essential (primary) hypertension: Secondary | ICD-10-CM

## 2022-10-22 NOTE — Patient Instructions (Signed)

## 2022-10-22 NOTE — Progress Notes (Signed)
Primary Care Provider: Sigmund Hazel, MD [facility] Cardiologist: Bryan Lemma, MD Electrophysiologist: None  Clinic Note: Chief Complaint  Patient presents with   Follow-up    6 months.   Atrial Fibrillation    Presumptive diagnosis of atrial fibrillation-1 spell lasting 30 minutes to an hour last night-heart rate up to 129 bpm    ===================================  ASSESSMENT/PLAN   Problem List Items Addressed This Visit       Cardiology Problems   PAT (paroxysmal atrial tachycardia) (Chronic)    These episodes seem to also be improved with the diltiazem.      Relevant Orders   EKG 12-Lead (Completed)   PAF (paroxysmal atrial fibrillation) (HCC) - Primary (Chronic)    Presumptive diagnosis with 1 episode on Apple Watch suggesting A-fib.  Not confirmed by monitor.  CHA2DS2-VASc score is 1-HTN.  Not on DOAC.  Up until last night episodes are pretty well-controlled on current dose of 120 mg diltiazem.  Will refill his PRN dosing although he has not had to use any with exception of last night.  Based on the infrequency of episodes and Apple Watch showing less than 2% A-fib, will will continue with rate control and baby aspirin.      Mixed hyperlipidemia (Chronic)    His PCP check labs but did not check lipids in March.  Should be due for follow-up soon.  I suspect the dramatic weight loss that his LDL will improve.  Was 123 as of November 2022.  Coronary Calcium Score 0, therefore we can continue to monitor with weight loss to hold off on medical therapy at this point.      Essential hypertension (Chronic)    BP well-controlled on current meds.  Only on low-dose diltiazem 120 mg with 10 mg lisinopril..  With significant weight loss, he likely will require additional antihypertensives at this point in time.        Other   Tachycardia    Well-controlled on current dose of diltiazem.  Only 1 episode requiring PRN dose.       ===================================  HPI:    Benjamin Paul is a 56 y.o. male with a PMH notable for HTN, HLD & Paroxysmal Tachycardia (presumptive Dx of PAF based upon Apple Watch) as well as short PAT burts who presents today for 6 month f/u .  Coronary Calcium Score of 0,   Benjamin Paul was last seen on April 20, 2022: Statin intolerant, doing okay with diltiazem.  Noted nocturnal frequency.  Recent Hospitalizations: None  Reviewed  CV studies:    The following studies were reviewed today: (if available, images/films reviewed: From Epic Chart or Care Everywhere) No new studies:  Interval History:   Benjamin Paul returns here today overall doing fairly well.  He has had 1 or 2 spells of fast heart rates the lasted about 30 minutes to an hour.  He said his heart rate got about 129 beats a minute.  I was actually the night before this visit.  This actually was only episodes had since I last saw him.  He has lost dramatic amount of weight than (had been 287 pounds) since his last visit--doing well on Ozempic.Marland Kitchen  He has not made a dramatic change in his diet.  He is exercising walking up to half an hour every day.  He says his blood pressure is notably improved.  His energy level is improved.  He is exercising more frequently and overall feeling better.  CV Review  of Symptoms (Summary): no chest pain or dyspnea on exertion positive for - dramatic weight loss.  1 episode of prolonged tachycardia last night. negative for - edema, orthopnea, paroxysmal nocturnal dyspnea, shortness of breath, or lightheadedness, dizziness or dizziness, syncope/near syncope or TIA/amaurosis fugax or claudication.  REVIEWED OF SYSTEMS   ROS Weight loss, cough, nocturnal frequency, nervous and anxious.  I have reviewed and (if needed) personally updated the patient's problem list, medications, allergies, past medical and surgical history, social and family history.   PAST MEDICAL HISTORY   Past  Medical History:  Diagnosis Date   Abdominal pain    Achilles tendinitis of left lower extremity 06/08/2011   DISC DISEASE, LUMBAR 12/13/2008   HYPERLIPIDEMIA 12/13/2008   As of June 2023: LDL 134, HDL 51, total chol 207   HYPERTENSION 12/13/2008   Morbid obesity due to excess calories (HCC)    Has lost significant weight.  BMI now 37.18.  Visit with hypertension hyperlipidemia-still criteria for morbid obesity.   NEPHROLITHIASIS, HX OF 12/13/2008   PAF (paroxysmal atrial fibrillation) (HCC) 08/2021   1 likely episode of Atrial Fibrillation noted on Apple Watch  -- Has not been documented since.   Paroxysmal atrial tachycardia 09/2021   Several runs of PAT noted on monitor.    PAST SURGICAL HISTORY   Past Surgical History:  Procedure Laterality Date   11-day Zio Patch  09/2021   Predominant rhythm Sinus: HR range 39-130 bpm, Avg 64 bpm. 13 ~SVT/PAT runs: fastest 197 bpm - 4 beats, Longest 20 beats - 104 bpm.  Rare (<1%) isolated PACs and PVCs. 1 5 beat run VT. no documented atrial fibrillation or atrial flutter.  No high-grade AVB or sinus pauses > 3 seconds.-->  1 patient triggered event correlated with PAC.   Coronary Calcium Score  09/2021   ZERO   NM GATED MYOCARDIAL STUDY (ARMX HX)  09/27/2021   [facility] (Dr. Rosemary HolmsPatwardhan): 1 day protocol Lexiscan Myoview/sestamibi: Normal myocardial perfusion with mildly reduced global thickening, EF 45%. -Intermediate risk due to low EF.   ROTATOR CUFF REPAIR     Dr. Priscille Kluverendall   TOE SURGERY Left    MOLE REMOVAL   TRANSTHORACIC ECHOCARDIOGRAM  09/13/2021   [facility] (Dr. Rosemary HolmsPatwardhan): Normal LV cavity size with mild concentric LVH.  Normal global wall motion with normal systolic function-EF 60-65%.  Normal diastolic filling pattern.  Borderline LA dilation.  No significant valvular abnormality with trace AI.  IVC not seen.    Coronary Calcium Score 0  Immunization History  Administered Date(s) Administered   Td  12/13/2008    MEDICATIONS/ALLERGIES   Current Meds  Medication Sig   Cholecalciferol (HM VITAMIN D3) 100 MCG (4000 UT) CAPS Take by mouth daily.   cyclobenzaprine (FLEXERIL) 5 MG tablet Take 5 mg by mouth 3 (three) times daily as needed.    diltiazem (CARDIZEM CD) 120 MG 24 hr capsule Take 1 capsule (120 mg total) by mouth daily.   diltiazem (CARDIZEM) 30 MG tablet Take 1 tablet (30 mg total) by mouth 3 (three) times daily as needed.   lisinopril (ZESTRIL) 10 MG tablet TAKE 1 TABLET BY MOUTH EVERY DAY   nitroGLYCERIN (NITROSTAT) 0.4 MG SL tablet Place 1 tablet (0.4 mg total) under the tongue every 5 (five) minutes as needed for chest pain.   Semaglutide (OZEMPIC, 0.25 OR 0.5 MG/DOSE, [place]) Inject into the skin.   valACYclovir (VALTREX) 1000 MG tablet Take 1 tablet by mouth daily.  81 mg aspirin  No Known  Allergies  SOCIAL HISTORY/FAMILY HISTORY   Reviewed in Epic:  Pertinent findings:  Social History   Tobacco Use   Smoking status: Never   Smokeless tobacco: Never  Vaping Use   Vaping Use: Never used  Substance Use Topics   Alcohol use: Yes    Comment: rare   Drug use: No   Social History   Social History Narrative   Not on file    OBJCTIVE -PE, EKG, labs   Wt Readings from Last 3 Encounters:  10/22/22 239 lb (108.4 kg)  04/20/22 281 lb 12.8 oz (127.8 kg)  04/06/22 287 lb (130.2 kg)    Physical Exam: BP 124/80 (BP Location: Left Arm, Patient Position: Sitting, Cuff Size: Large)   Pulse 70   Ht 6\' 1"  (1.854 m)   Wt 239 lb (108.4 kg)   BMI 31.53 kg/m  BP log shows blood pressure ranged from 101/75 to 118/76 with heart rate ranging from 53 to 66 bpm Physical Exam Vitals (At home: This morning's blood pressure 101/75 heart rate 53 to 60 bpm.) reviewed.  Constitutional:      General: He is not in acute distress.    Appearance: Normal appearance. He is obese. He is not ill-appearing (Healthy-appearing.  Well-groomed.) or toxic-appearing.     Comments: Technically  obese but significantly reduced weight from last visit.  HENT:     Head: Normocephalic and atraumatic.  Neck:     Vascular: No carotid bruit or JVD.  Cardiovascular:     Rate and Rhythm: Normal rate and regular rhythm. No extrasystoles are present.    Chest Wall: PMI is not displaced.     Pulses: Normal pulses.     Heart sounds: S1 normal and S2 normal. Heart sounds are distant. No murmur heard.    No friction rub. No gallop.  Pulmonary:     Effort: Pulmonary effort is normal. No respiratory distress.     Breath sounds: Normal breath sounds. No wheezing, rhonchi or rales.  Chest:     Chest wall: No tenderness.  Musculoskeletal:        General: No swelling. Normal range of motion.     Cervical back: Normal range of motion and neck supple.  Skin:    General: Skin is dry.  Neurological:     General: No focal deficit present.     Mental Status: He is alert and oriented to person, place, and time.     Gait: Gait normal.  Psychiatric:        Mood and Affect: Mood normal.        Behavior: Behavior normal.        Thought Content: Thought content normal.        Judgment: Judgment normal.     Adult ECG Report  Rate: 70 ;  Rhythm: normal sinus rhythm and left atrial enlargement. ;   Narrative Interpretation: Stable  Recent Labs:  PCP ran labs in March, but did not check lipids.  Lab Results  Component Value Date   CHOL 198 09/06/2021   HDL 46 09/06/2021   LDLCALC 123 (H) 09/06/2021   TRIG 161 (H) 09/06/2021   CHOLHDL 4.3 09/06/2021    Lab Results  Component Value Date   CREATININE 1.01 09/06/2021   BUN 13 09/06/2021   NA 140 09/06/2021   K 4.6 09/06/2021   CL 103 09/06/2021   CO2 24 09/06/2021       Latest Ref Rng & Units 09/06/2021    8:16 AM 09/01/2010  9:35 AM 01/11/2008   11:52 AM  CBC  WBC 3.4 - 10.8 x10E3/uL 5.6  6.8  7.3   Hemoglobin 13.0 - 17.7 g/dL 50.3  88.8  28.0   Hematocrit 37.5 - 51.0 % 51.0  48.3  30.4   Platelets 150 - 450 x10E3/uL 231  224.0  630  RESULT REPEATED AND VERIFIED DELTA CHECK NOTED     No results found for: "HGBA1C" Lab Results  Component Value Date   TSH 1.36 09/01/2010    ================================================== I spent a total of 28 minutes with the patient spent in direct patient consultation.  Additional time spent with chart review  / charting (studies, outside notes, etc): 14 min Total Time: 42 min  Current medicines are reviewed at length with the patient today.  (+/- concerns) n/a  Notice: This dictation was prepared with Dragon dictation along with smart phrase technology. Any transcriptional errors that result from this process are unintentional and may not be corrected upon review.  Studies Ordered:   Orders Placed This Encounter  Procedures   EKG 12-Lead   No orders of the defined types were placed in this encounter.   Patient Instructions / Medication Changes & Studies & Tests Ordered   Patient Instructions  Medication Instructions:  No changes   *If you need a refill on your cardiac medications before your next appointment, please call your pharmacy*   Lab Work: Not needed    Testing/Procedures:  Not needed  Follow-Up: At [facility], you and your health needs are our priority.  As part of our continuing mission to provide you with exceptional heart care, we have created designated Provider Care Teams.  These Care Teams include your primary Cardiologist (physician) and Advanced Practice Providers (APPs -  Physician Assistants and Nurse Practitioners) who all work together to provide you with the care you need, when you need it.     Your next appointment:   12 month(s)  The format for your next appointment:   In Person  Provider:   Bryan Lemma, MD       Marykay Lex, MD, MS Bryan Lemma, M.D., M.S. Interventional Cardiologist  [facility]  Pager # [phone] Phone # [phone] [address] [place], [place]  [address]   Thank you for choosing [facility]!!

## 2022-11-07 ENCOUNTER — Encounter: Payer: Self-pay | Admitting: Cardiology

## 2022-11-07 NOTE — Assessment & Plan Note (Signed)
Well-controlled on current dose of diltiazem.  Only 1 episode requiring PRN dose.

## 2022-11-07 NOTE — Assessment & Plan Note (Signed)
His PCP check labs but did not check lipids in March.  Should be due for follow-up soon.  I suspect the dramatic weight loss that his LDL will improve.  Was 34 as of November 2022.  Coronary Calcium Score 0, therefore we can continue to monitor with weight loss to hold off on medical therapy at this point.

## 2022-11-07 NOTE — Assessment & Plan Note (Signed)
These episodes seem to also be improved with the diltiazem.

## 2022-11-07 NOTE — Assessment & Plan Note (Signed)
BP well-controlled on current meds.  Only on low-dose diltiazem 120 mg with 10 mg lisinopril..  With significant weight loss, he likely will require additional antihypertensives at this point in time.

## 2022-11-07 NOTE — Assessment & Plan Note (Addendum)
Presumptive diagnosis with 1 episode on Apple Watch suggesting A-fib.  Not confirmed by monitor.  CHA2DS2-VASc score is 1-HTN.  Not on DOAC.  Up until last night episodes are pretty well-controlled on current dose of 120 mg diltiazem.  Will refill his PRN dosing although he has not had to use any with exception of last night.  Based on the infrequency of episodes and Apple Watch showing less than 2% A-fib, will will continue with rate control and baby aspirin.

## 2022-12-12 ENCOUNTER — Telehealth: Payer: Self-pay | Admitting: Cardiology

## 2022-12-12 DIAGNOSIS — I480 Paroxysmal atrial fibrillation: Secondary | ICD-10-CM

## 2022-12-12 DIAGNOSIS — I10 Essential (primary) hypertension: Secondary | ICD-10-CM

## 2022-12-12 DIAGNOSIS — I209 Angina pectoris, unspecified: Secondary | ICD-10-CM

## 2022-12-12 MED ORDER — DILTIAZEM HCL 30 MG PO TABS: 30.0000 mg | ORAL_TABLET | Freq: Three times a day (TID) | ORAL | 1 refills | Status: AC | PRN

## 2022-12-12 MED ORDER — NITROGLYCERIN 0.4 MG SL SUBL: 0.4000 mg | SUBLINGUAL_TABLET | SUBLINGUAL | 3 refills | Status: AC | PRN

## 2022-12-12 MED ORDER — DILTIAZEM HCL ER COATED BEADS 120 MG PO CP24: 120.0000 mg | ORAL_CAPSULE | Freq: Every day | ORAL | 3 refills | Status: DC

## 2022-12-12 MED ORDER — LISINOPRIL 10 MG PO TABS: 10.0000 mg | ORAL_TABLET | Freq: Every day | ORAL | 3 refills | Status: DC

## 2022-12-12 NOTE — Telephone Encounter (Signed)
*  STAT* If patient is at the pharmacy, call can be transferred to refill team.   1. Which medications need to be refilled? (please list name of each medication and dose if known)  lisinopril (ZESTRIL) 10 MG tablet diltiazem (CARDIZEM CD) 120 MG 24 hr capsule  diltiazem (CARDIZEM) 30 MG tablet nitroGLYCERIN (NITROSTAT) 0.4 MG SL tablet   2. Which pharmacy/location (including street and city if local pharmacy) is medication to be sent to?  [id number] - [place], [place] - [address]  3. Do they need a 30 day or 90 day supply?  90 day supply

## 2023-02-07 ENCOUNTER — Encounter: Payer: Self-pay | Admitting: Cardiology

## 2023-06-13 ENCOUNTER — Telehealth: Payer: Self-pay | Admitting: Cardiology

## 2023-06-13 NOTE — Telephone Encounter (Signed)
Patient called upset because he received letter to schedule yearly and by the tome he received letter Dr. Herbie Baltimore was booked for December.   I scheduled him for his yearly for end of November.

## 2023-06-13 NOTE — Telephone Encounter (Signed)
Patient is requesting to speak with a nurse. Please advise.

## 2023-06-19 NOTE — Telephone Encounter (Signed)
No action done

## 2023-07-08 IMAGING — CT CT CARDIAC CORONARY ARTERY CALCIUM SCORE
3 series · 14 of 20 positions shown, 16 images · non-contrast
Comparison: None.

CLINICAL DATA: 55-year-old Caucasian male with history
hyperlipidemia, hypertension and family history of heart disease.

EXAM:
CT CARDIAC CORONARY ARTERY CALCIUM SCORE
TECHNIQUE: Non-contrast imaging through the heart was performed using
prospective ECG gating. Image post processing was performed on an
independent workstation, allowing for quantitative analysis of the
heart and coronary arteries. Note that this exam targets the heart
and the chest was not imaged in its entirety.

[Series 2: calcium scoring 2.00 qr36 bestdiast 68% hrt calciu · axial · 0.46mm/px · z∈[+1606,+1690]mm · 4 of 70 slices shown]
[im 14/70  vessel]
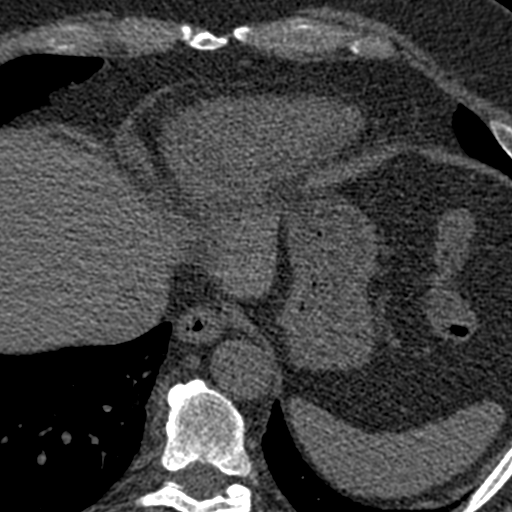
[im 28/70  vessel]
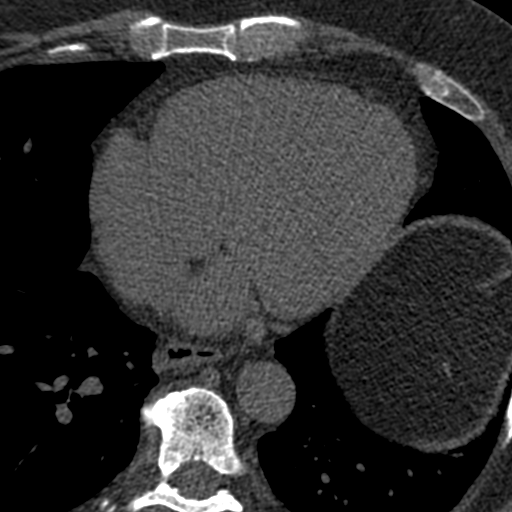
[im 42/70  vessel]
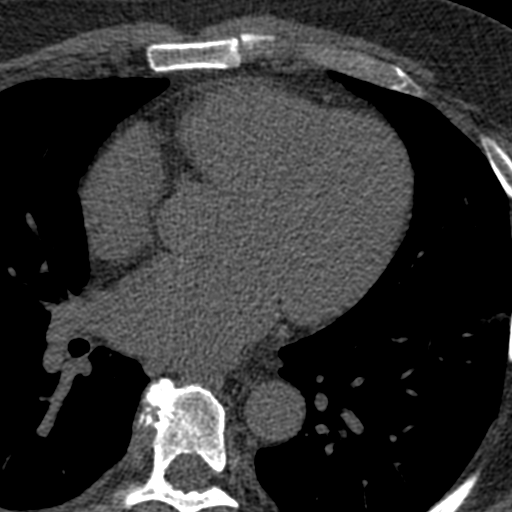
[im 56/70  vessel]
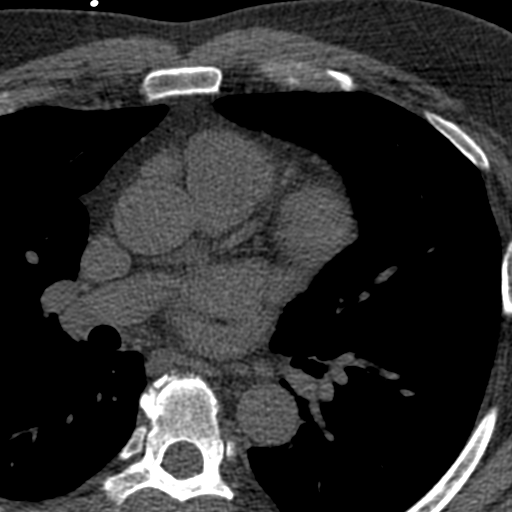

[Series 3: calcium scoring 2.00 br40 bestdiast 68% axial · axial · 0.69mm/px · z∈[+1602,+1694]mm · 5 of 70 slices shown, 7 images]
[im 12/70  vessel]
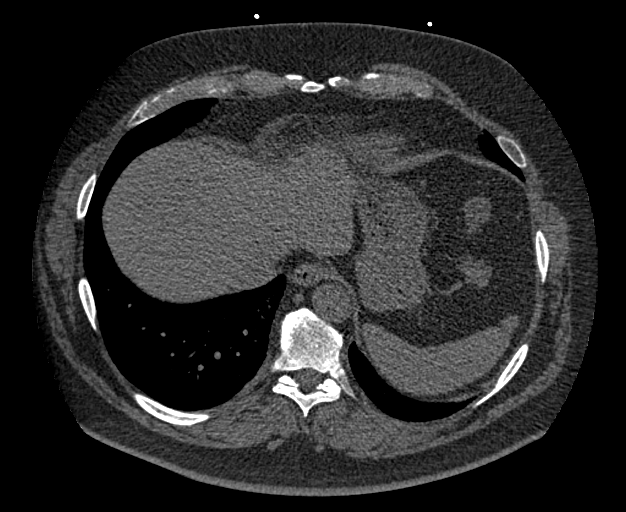
[im 12/70  lung]
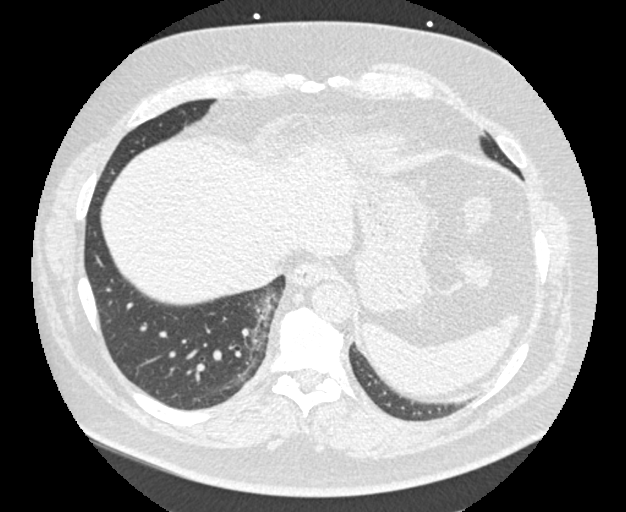
[im 24/70  vessel]
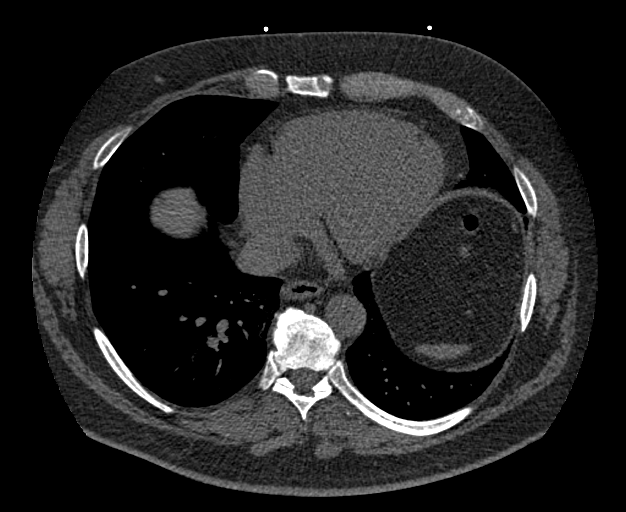
[im 35/70  vessel]
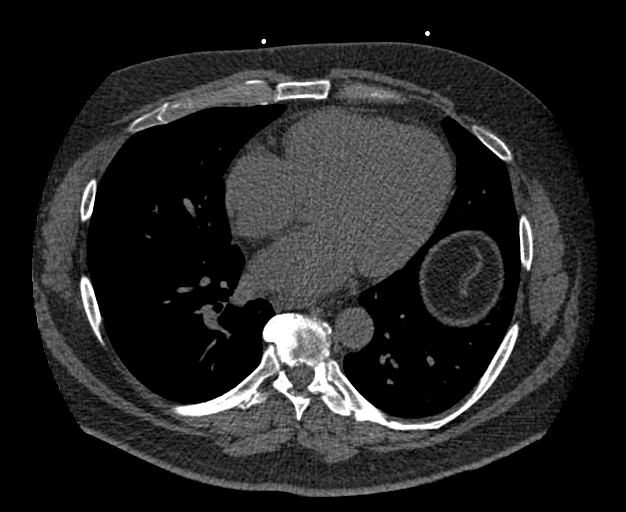
[im 47/70  vessel]
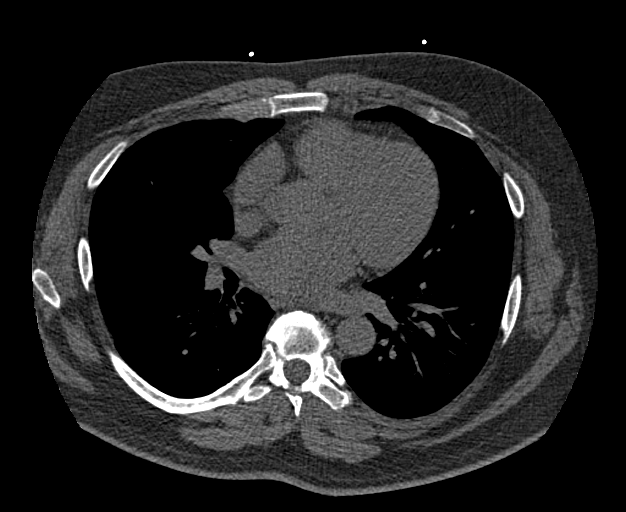
[im 58/70  vessel]
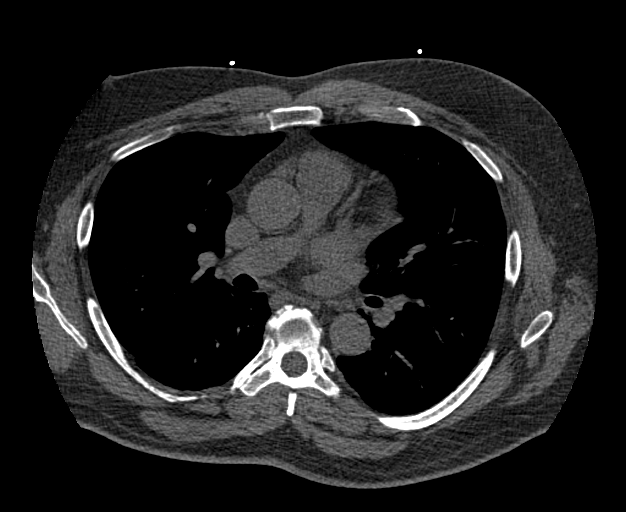
[im 58/70  lung]
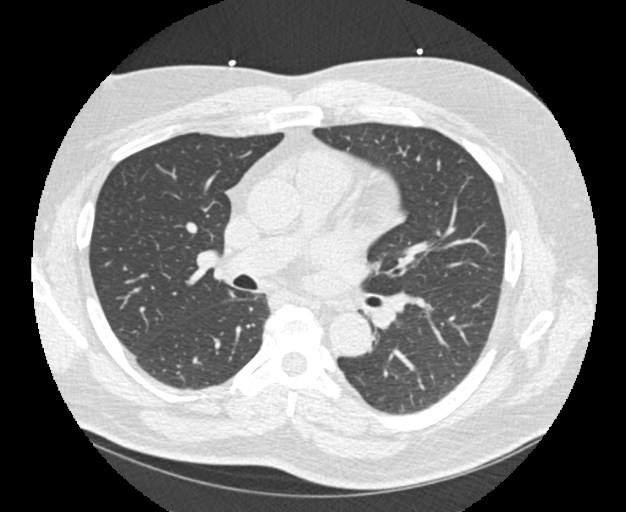

[Series 9: calcium scoring 2.00 br60 bestdiast 68% lungs · axial · 0.69mm/px · z∈[+1602,+1694]mm · 5 of 70 slices shown]
[im 12/70  vessel]
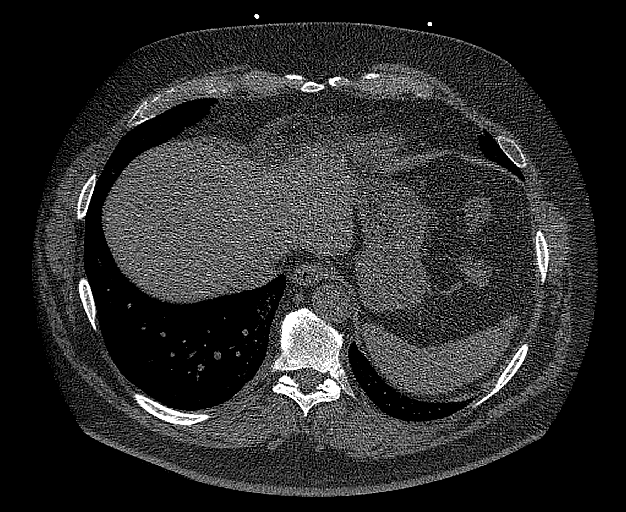
[im 24/70  vessel]
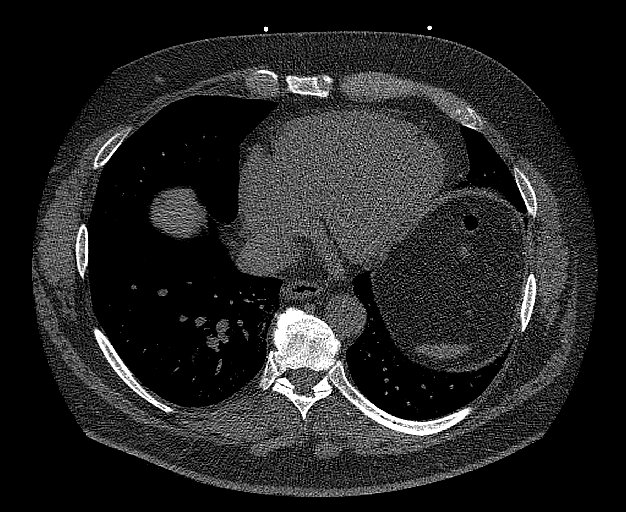
[im 35/70  vessel]
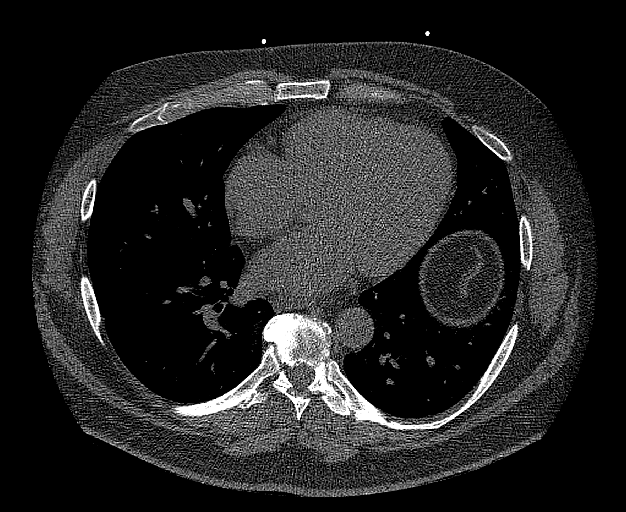
[im 47/70  vessel]
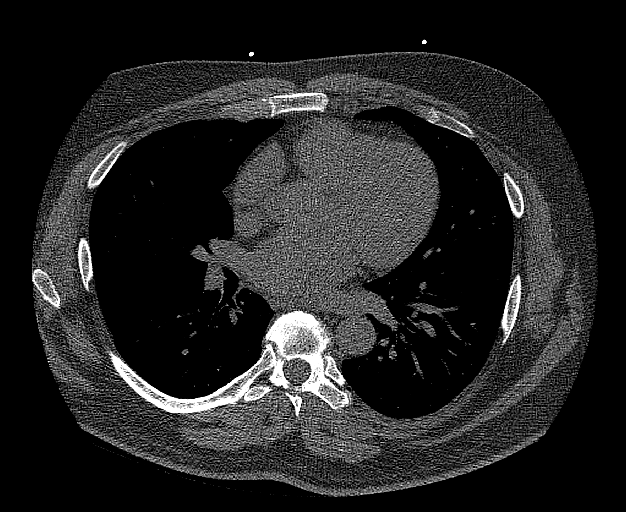
[im 58/70  vessel]
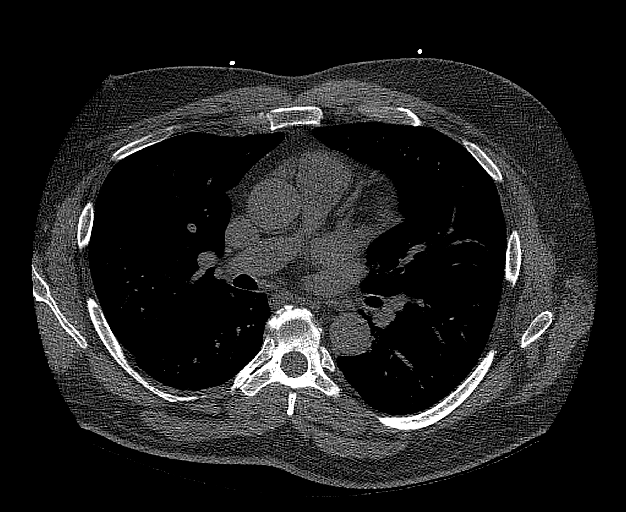

[14 of 20 positions shown; findings below may reference images not displayed]

FINDINGS: CORONARY CALCIUM SCORES:

Left Main: 0

LAD: 0

LCx: 0

RCA: 0

Total Agatston Score: 0

[HOSPITAL] percentile: 0

AORTA MEASUREMENTS:

Ascending Aorta: 34 mm

Descending Aorta: 27 mm

OTHER FINDINGS:


## 2023-10-02 ENCOUNTER — Ambulatory Visit: Payer: No Typology Code available for payment source | Attending: Cardiology | Admitting: Cardiology

## 2023-10-02 ENCOUNTER — Encounter: Payer: Self-pay | Admitting: Cardiology

## 2023-10-02 VITALS — BP 130/72 | HR 61 | Ht 72.0 in | Wt 226.0 lb

## 2023-10-02 DIAGNOSIS — I480 Paroxysmal atrial fibrillation: Secondary | ICD-10-CM | POA: Diagnosis not present

## 2023-10-02 DIAGNOSIS — E66811 Obesity, class 1: Secondary | ICD-10-CM | POA: Diagnosis not present

## 2023-10-02 DIAGNOSIS — Z6830 Body mass index (BMI) 30.0-30.9, adult: Secondary | ICD-10-CM

## 2023-10-02 DIAGNOSIS — I10 Essential (primary) hypertension: Secondary | ICD-10-CM | POA: Diagnosis not present

## 2023-10-02 DIAGNOSIS — E782 Mixed hyperlipidemia: Secondary | ICD-10-CM | POA: Diagnosis not present

## 2023-10-02 DIAGNOSIS — I4719 Other supraventricular tachycardia: Secondary | ICD-10-CM

## 2023-10-02 DIAGNOSIS — E661 Drug-induced obesity: Secondary | ICD-10-CM

## 2023-10-02 NOTE — Patient Instructions (Addendum)
Medication Instructions:    Continue current  medication   Recommend if you have an episode lasting more than a day -  take 20 mg dose of Propranolol  the first day  then next 3 days take 1/2 tablet ( 10 mg) twice a day  , then 20 mg for 2 days then stop   *If you need a refill on your cardiac medications before your next appointment, please call your pharmacy*   Lab Work: Not needed    Testing/Procedures: Not needed   Follow-Up: At [facility], you and your health needs are our priority.  As part of our continuing mission to provide you with exceptional heart care, we have created designated Provider Care Teams.  These Care Teams include your primary Cardiologist (physician) and Advanced Practice Providers (APPs -  Physician Assistants and Nurse Practitioners) who all work together to provide you with the care you need, when you need it.    Your next appointment:   9 month(s) ( Aug 2025)   The format for your next appointment:   In Person  Provider:   Bryan Lemma, MD    Other Instructions

## 2023-10-02 NOTE — Progress Notes (Unsigned)
Cardiology Office Note:  .   Date:  10/03/2023  ID:  Benjamin Paul, DOB 01-Feb-1966, MRN [id number] PCP: Benjamin Hazel, MD  [facility] Providers Cardiologist:  Bryan Lemma, MD     Chief Complaint  Patient presents with   Follow-up    For the most part doing well.  Still has fast heart rate spells   Atrial Fibrillation    And atrial tachycardia    Patient Profile: .     Benjamin Paul is a  57 y.o. male with a PMH notable for HTN, HLD (statin intolerant) & Paroxysmal Tachycardia (presumptive Dx of PAF based upon Apple Watch) as well as short PAT burts, and Coronary Calcium Score 0 who presents here for annual /fu at the request of Benjamin Hazel, MD. Presumptive diagnosis of A-fib from an episode recorded on Apple Watch suggesting A-fib.  Not confirmed by monitor.  Not on DOAC.  On aspirin.    Benjamin Paul was last seen on 10/22/2022 for routine follow-up.  Doing well.  May have 1 or 2 episodes of tachycardia lasting between 30 minutes to an hour.  Heart rate may get up 30 minutes.  125-130 beats a minute.  Usually at night he had lost a significant [place] weight since being on Ozempic.  (From 287 down to 239 pounds June 2023).  Also made significant lifestyle changes with dietary changes and increasing exercise level.  Energy level improved.  Subjective  Discussed the use of AI scribe software for clinical note transcription with the patient, who gave verbal consent to proceed.  History of Present Illness   The patient, with a history of palpitations and rapid heart rates, reports continued intermittent episodes of these symptoms. These episodes typically last between two to five minutes and occur sporadically, with periods of daily occurrences for up to three days, followed by symptom-free intervals of up to three weeks. The patient notes that these episodes often occur either first thing in the morning or during periods of relaxation, but not during sleep. The patient has  been monitoring his heart rate during these episodes, reporting rates ranging from 140 to 180 beats per minute, though the higher rates are rare.    The patient has been managing these episodes with a regimen of diltiazem, both extended-release and short-acting forms. The short-acting form is taken infrequently, with the patient reporting usage of approximately a dozen doses per month. Recently, the patient was prescribed propranolol by a wellness doctor for potential anxiety-related triggers of these episodes. He was reluctant to take the new medication but has taken it a few times.  The patient denies any associated symptoms of syncope, shortness of breath, or chest tightness during these episodes. He also reports an ability to predict the onset of these episodes, noting a sensation of tightness in the pit of his stomach and throat. Interestingly, these episodes do not occur during periods of physical activity, such as daily walks or hiking, which the patient regularly engages in.  The patient has also been on Ozempic, a medication for diabetes, although he does not have a diagnosis of diabetes. Over the past year, the patient has experienced significant weight loss, dropping from 281 pounds to 226 pounds. The patient's most recent labs, taken a month prior, showed a total cholesterol of 168, triglycerides of 66, HDL of 52, LDL of 100, and an A1c of 5.1. The patient denies taking any aspirin.     Cardiovascular ROS: no chest pain or dyspnea  on exertion positive for - irregular heartbeat, palpitations, rapid heart rate, and overall pretty well-controlled, but as well as in the last up 5-10 minutes-no more than 15.  No associated lightheadedness or dizziness or dyspnea. negative for - edema, orthopnea, paroxysmal nocturnal dyspnea, shortness of breath, or syncope or near syncope, TIA or amaurosis fugax, claudication  ROS:  Review of Systems - Negative except seems like some of the symptoms are related  to social stress.  Continued weight loss-intentional    Objective   Current Meds  Medication Sig   Cholecalciferol (HM VITAMIN D3) 100 MCG (4000 UT) CAPS Take by mouth daily.   cyclobenzaprine (FLEXERIL) 5 MG tablet Take 5 mg by mouth 3 (three) times daily as needed.    diltiazem (CARDIZEM CD) 120 MG 24 hr capsule Take 1 capsule (120 mg total) by mouth daily.   diltiazem (CARDIZEM) 30 MG tablet Take 1 tablet (30 mg total) by mouth 3 (three) times daily as needed.   lisinopril (ZESTRIL) 10 MG tablet Take 1 tablet (10 mg total) by mouth daily.   propranolol (INDERAL) 20 MG tablet Take 20 mg by mouth as needed.   Semaglutide (OZEMPIC, 0.25 OR 0.5 MG/DOSE, [place]) Inject into the skin.   valACYclovir (VALTREX) 1000 MG tablet Take 1 tablet by mouth daily.    Studies Reviewed: Marland Kitchen   EKG Interpretation Date/Time:  Wednesday October 02 2023 08:15:59 EST Ventricular Rate:  61 PR Interval:  162 QRS Duration:  108 QT Interval:  414 QTC Calculation: 416 R Axis:   81  Text Interpretation: Normal sinus rhythm Normal ECG When compared with ECG of 03-Feb-2007 09:59, T wave inversion no longer evident in Inferior leads Confirmed by Bryan Lemma ([id number]) on 10/02/2023 8:21:39 AM   No new studes  LABS Total cholesterol: 168 (08/28/2023) Triglycerides: 66 (08/28/2023) HDL: 52 (08/28/2023) LDL: 130 (08/28/2023) A1c: 5.1 (08/28/2023) Hb: 6.3 (08/28/2023) K: 4.5 (08/28/2023)  DIAGNOSTIC Stress test: Normal (2022)  Risk Assessment/Calculations:    CHA2DS2-VASc Score = 1   This indicates a 0.6% annual risk of stroke. The patient's score is based upon: CHF History: 0 HTN History: 1 Diabetes History: 0 Stroke History: 0 Vascular Disease History: 0 Age Score: 0 Gender Score: 0              Physical Exam:   VS:  BP 130/72 (BP Location: Left Arm, Patient Position: Sitting, Cuff Size: Large)   Pulse 61   Ht 6' (1.829 m)   Wt 226 lb (102.5 kg)   SpO2 97%   BMI 30.65 kg/m    Wt Readings  from Last 3 Encounters:  10/02/23 226 lb (102.5 kg)  10/22/22 239 lb (108.4 kg)  04/20/22 281 lb 12.8 oz (127.8 kg)    GEN: Well nourished, well developed in no acute distress; minimally "obese"; healthy appearing; continued weight loss noted NECK: No JVD; No carotid bruits CARDIAC: Normal S1, S2; RRR, no murmurs, rubs, gallops RESPIRATORY:  Clear to auscultation without rales, wheezing or rhonchi ; nonlabored, good air movement. ABDOMEN: Soft, non-tender, non-distended EXTREMITIES:  No edema; No deformity     ASSESSMENT AND PLAN: .    Problem List Items Addressed This Visit       Cardiology Problems   Essential hypertension - Primary (Chronic)    BP pretty well-controlled on current dose of lisinopril and diltiazem.      Relevant Medications   propranolol (INDERAL) 20 MG tablet   Mixed hyperlipidemia (Chronic)    LDL 100 on no current  medications for lipids.  With the weight loss on Ozempic, he has had pretty good success with lipid-lowering well.  Continue to monitor.      Relevant Medications   propranolol (INDERAL) 20 MG tablet   PAF (paroxysmal atrial fibrillation) (HCC) (Chronic)    Intermittent episodes lasting 2-5 minutes, occurring in bursts over several days then subsiding for weeks. Episodes often occur during periods of rest or transition from activity to rest. No associated syncope, dyspnea, or chest pain. Currently on Diltiazem with some control of heart rate during episodes. -Continue Diltiazem XT 120 mg as prescribed. -Use Propranolol 20mg  as needed during episodes, up to twice daily for 3 days, then once daily for 2 days to prevent rebound effect. -Give CHADS2VASC Score of ~1, will hold off on OAC for now, especially given short lived spells.      Relevant Medications   propranolol (INDERAL) 20 MG tablet   Other Relevant Orders   EKG 12-Lead (Completed)   PAT (paroxysmal atrial tachycardia) (HCC) (Chronic)    Quite likely that some of the "atrial  fibrillation spells "are probably more short bursts of a atrial tachycardia.  For now, since the symptoms are pretty much the same would continue the same therapy as with standing dose diltiazem XT and using as needed propranolol.      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   281lbs in June of the previous year to 226lbs currently, likely contributing to improved cardiovascular health.Remains very active and exercising.  Congratulated on his efforts. -Continue current exercise and dietary regimen. - Continues to be on Ozempic on a maintenance dose and losing weight slowly now.          Follow-Up: Return in about 9 months (around 07/01/2024) for 9 month -1 Yr Follow-up, Routine follow up with me.  Total time spent: 31 min spent with patient + spent charting = 47 min    Signed, Marykay Lex, MD, MS Bryan Lemma, M.D., M.S. Interventional Cardiologist  [facility]  Pager # [phone] Phone # [phone] [address] [place], [place] [address]

## 2023-10-03 ENCOUNTER — Encounter: Payer: Self-pay | Admitting: Cardiology

## 2023-10-03 NOTE — Assessment & Plan Note (Addendum)
Notable weight loss now down to a BMI of just over 30.   281lbs in June of the previous year to 226lbs currently, likely contributing to improved cardiovascular health.Remains very active and exercising.  Congratulated on his efforts. -Continue current exercise and dietary regimen. - Continues to be on Ozempic on a maintenance dose and losing weight slowly now.

## 2023-10-03 NOTE — Assessment & Plan Note (Signed)
BP pretty well-controlled on current dose of lisinopril and diltiazem.

## 2023-10-03 NOTE — Assessment & Plan Note (Signed)
Intermittent episodes lasting 2-5 minutes, occurring in bursts over several days then subsiding for weeks. Episodes often occur during periods of rest or transition from activity to rest. No associated syncope, dyspnea, or chest pain. Currently on Diltiazem with some control of heart rate during episodes. -Continue Diltiazem XT 120 mg as prescribed. -Use Propranolol 20mg  as needed during episodes, up to twice daily for 3 days, then once daily for 2 days to prevent rebound effect. -Give CHADS2VASC Score of ~1, will hold off on OAC for now, especially given short lived spells.

## 2023-10-03 NOTE — Assessment & Plan Note (Signed)
LDL 100 on no current medications for lipids.  With the weight loss on Ozempic, he has had pretty good success with lipid-lowering well.  Continue to monitor.

## 2023-10-03 NOTE — Assessment & Plan Note (Signed)
Quite likely that some of the "atrial fibrillation spells "are probably more short bursts of a atrial tachycardia.  For now, since the symptoms are pretty much the same would continue the same therapy as with standing dose diltiazem XT and using as needed propranolol.

## 2023-12-05 ENCOUNTER — Other Ambulatory Visit: Payer: Self-pay | Admitting: Cardiology

## 2023-12-05 DIAGNOSIS — I480 Paroxysmal atrial fibrillation: Secondary | ICD-10-CM

## 2023-12-05 DIAGNOSIS — I10 Essential (primary) hypertension: Secondary | ICD-10-CM

## 2024-02-27 ENCOUNTER — Other Ambulatory Visit: Payer: Self-pay | Admitting: Cardiology

## 2024-02-27 DIAGNOSIS — I10 Essential (primary) hypertension: Secondary | ICD-10-CM

## 2024-08-22 ENCOUNTER — Other Ambulatory Visit: Payer: Self-pay | Admitting: Cardiology

## 2024-08-22 DIAGNOSIS — I10 Essential (primary) hypertension: Secondary | ICD-10-CM

## 2024-09-16 ENCOUNTER — Encounter: Payer: Self-pay | Admitting: Cardiology

## 2024-09-16 ENCOUNTER — Ambulatory Visit: Attending: Cardiology | Admitting: Cardiology

## 2024-09-16 ENCOUNTER — Encounter: Payer: Self-pay | Admitting: *Deleted

## 2024-09-16 VITALS — BP 128/84 | HR 54 | Ht 72.0 in | Wt 244.0 lb

## 2024-09-16 DIAGNOSIS — I4719 Other supraventricular tachycardia: Secondary | ICD-10-CM

## 2024-09-16 DIAGNOSIS — I10 Essential (primary) hypertension: Secondary | ICD-10-CM | POA: Diagnosis not present

## 2024-09-16 DIAGNOSIS — I480 Paroxysmal atrial fibrillation: Secondary | ICD-10-CM

## 2024-09-16 NOTE — Assessment & Plan Note (Signed)
 Blood pressure well-controlled taking 10 mg lisinopril  along with the 120 mg diltiazem  CD  Echo.  No change.

## 2024-09-16 NOTE — Assessment & Plan Note (Signed)
 Intermittent palpitations with nocturnal heart rate variations suggestive of atrial fibrillation.  Propranolol used prophylactically. No significant cardiac symptoms reported. - Ordered 30-day event monitor to compare heart rate variability with Apple Watch data.

## 2024-09-16 NOTE — Progress Notes (Unsigned)
 Patient enrolled for [employer]/ [employer] to ship a 30 day cardiac event monitor to his address on file. Dr. Anner to read.

## 2024-09-16 NOTE — Patient Instructions (Addendum)
 Medication Instructions:  No changes   *If you need a refill on your cardiac medications before your next appointment, please call your pharmacy*   Lab Work: Not needed    Testing/Procedures: Your physician has recommended that you wear an event monitor 30 day . Event monitors are medical devices that record the heart's electrical activity. Doctors most often us  these monitors to diagnose arrhythmias. Arrhythmias are problems with the speed or rhythm of the heartbeat. The monitor is a small, portable device. You can wear one while you do your normal daily activities. This is usually used to diagnose what is causing palpitations/syncope (passing out).    Follow-Up: At [facility], you and your health needs are our priority.  As part of our continuing mission to provide you with exceptional heart care, we have created designated Provider Care Teams.  These Care Teams include your primary Cardiologist (physician) and Advanced Practice Providers (APPs -  Physician Assistants and Nurse Practitioners) who all work together to provide you with the care you need, when you need it.     Your next appointment:   12 month(s)  The format for your next appointment:   In Person  Provider:  VILA Alm Clay, MD  Dr Clay will contact you in Jan by phone to check up on you  Nov 20, 2024 @11 :40 am  Other Instructions  [employer] Cardiac Event Monitor Instructions  Your physician has requested you wear your cardiac event monitor for _____ days, (1-30). [employer] may call or text to confirm a shipping address. The monitor will be sent to a land address via UPS. [employer] will not ship a monitor to a PO BOX. It typically takes 3-5 days to receive your monitor after it has been enrolled. [employer] will assist with USPS tracking if your package is delayed. The telephone number for [employer] is [phone]. Once you have received your monitor, please review the enclosed instructions.  Instruction tutorials can also be viewed under help and settings on the enclosed cell phone. Your monitor has already been registered assigning a specific monitor serial # to you.  Billing and Self Pay Discount Information  [employer] has been provided the insurance information we had on file for you.  If your insurance has been updated, please call [employer] at [phone] to provide them with your updated insurance information.   [employer] offers a discounted Self Pay option for patients who have insurance that does not cover their cardiac event monitor or patients without insurance.  The discounted cost of a Self Pay Cardiac Event Monitor would be $225.00 , if the patient contacts [employer] at [phone] within 7 days of applying the monitor to make payment arrangements.  If the patient does not contact [employer] within 7 days of applying the monitor, the cost of the cardiac event monitor will be $350.00.  Applying the monitor  Remove cell phone from case and turn it on. The cell phone works as it consultant and needs to be within [employer] of you at all times. The cell phone will need to be charged on a daily basis. We recommend you plug the cell phone into the enclosed charger at your bedside table every night.  Monitor batteries: You will receive two monitor batteries labelled #1 and #2. These are your recorders. Plug battery #2 onto the second connection on the enclosed charger. Keep one battery on the charger at all times. This will keep the monitor battery deactivated. It will also keep it fully charged for when you  need to switch your monitor batteries. A small light will be blinking on the battery emblem when it is charging. The light on the battery emblem will remain on when the battery is fully charged.  Open package of a Monitor strip. Insert battery #1 into black hood on strip and gently squeeze monitor battery onto connection as indicated in instruction booklet. Set  aside while preparing skin.  Choose location for your strip, vertical or horizontal, as indicated in the instruction booklet. Shave to remove all hair from location. There cannot be any lotions, oils, powders, or colognes on skin where monitor is to be applied. Wipe skin clean with enclosed Saline wipe. Dry skin completely.  Peel paper labeled #1 off the back of the Monitor strip exposing the adhesive. Place the monitor on the chest in the vertical or horizontal position shown in the instruction booklet. One arrow on the monitor strip must be pointing upward. Carefully remove paper labeled #2, attaching remainder of strip to your skin. Try not to create any folds or wrinkles in the strip as you apply it.  Firmly press and release the circle in the center of the monitor battery. You will hear a small beep. This is turning the monitor battery on. The heart emblem on the monitor battery will light up every 5 seconds if the monitor battery in turned on and connected to the patient securely. Do not push and hold the circle down as this turns the monitor battery off. The cell phone will locate the monitor battery. A screen will appear on the cell phone checking the connection of your monitor strip. This may read poor connection initially but change to good connection within the next minute. Once your monitor accepts the connection you will hear a series of 3 beeps followed by a climbing crescendo of beeps. A screen will appear on the cell phone showing the two monitor strip placement options. Touch the picture that demonstrates where you applied the monitor strip.  Your monitor strip and battery are waterproof. You are able to shower, bathe, or swim with the monitor on. They just ask you do not submerge deeper than 3 feet underwater. We recommend removing the monitor if you are swimming in a lake, river, or ocean.  Your monitor battery will need to be switched to a fully charged monitor battery  approximately once a week. The cell phone will alert you of an action which needs to be made.  On the cell phone, tap for details to reveal connection status, monitor battery status, and cell phone battery status. The green dots indicates your monitor is in good status. A red dot indicates there is something that needs your attention.  To record a symptom, click the circle on the monitor battery. In 30-60 seconds a list of symptoms will appear on the cell phone. Select your symptom and tap save. Your monitor will record a sustained or significant arrhythmia regardless of you clicking the button. Some patients do not feel the heart rhythm irregularities. [employer] will notify us  of any serious or critical events.  Refer to instruction booklet for instructions on switching batteries, changing strips, the Do not disturb or Pause features, or any additional questions.  Call [employer] at [phone], to confirm your monitor is transmitting and record your baseline. They will answer any questions you may have regarding the monitor instructions at that time.  Returning the monitor to [employer]  Place all equipment back into blue box. Peel off strip of paper to expose  adhesive and close box securely. There is a prepaid UPS shipping label on this box. Drop in a UPS drop box, or at a UPS facility like [employer]. You may also contact [employer] to arrange UPS to pick up monitor package at your home.

## 2024-09-16 NOTE — Assessment & Plan Note (Signed)
 Need to delineate between PAF and PAT.  Apple watch suggesting possible A-fib, but he is only having these episodes while he is sleeping and therefore does not have rhythm strips.  - 30-day monitor in order to correlate Apple watch results with rhythm strips on monitor.

## 2024-09-16 NOTE — Progress Notes (Signed)
 Cardiology Office Note:  .   Date:  09/16/2024  ID:  Benjamin Paul, DOB 1966-06-03, MRN [id number] PCP: Cleotilde Planas, MD  [facility] Providers Cardiologist:  Paul Clay, MD     Chief Complaint  Patient presents with   Follow-up    Annual - no major issues   Palpitations    Much less frequent than 1 yr ago    Patient Profile: .     Benjamin Paul is a  58 y.o. male  with a PMH notable for short bursts of PAT (possible diagnosis of PAF), HTN, HLD (statin intolerant), with CAC of 0 who presents here for annual follow-up at the request of Cleotilde Planas, MD.  Presumptive diagnosis of A-fib from an episode recorded on Apple Watch suggesting A-fib.  Not confirmed by monitor.  Not on DOAC.  On aspirin.      Benjamin Paul was last seen on 10/02/2023: Still noting intermittent episodes of palpitations and fast heart rates lasting 2 to 5 minutes sometimes can happen up to 3 days neuro and can sometimes go weeks without them.  Rates can range anywhere from 140 to 180 bpm.  No other associated symptoms.  He had noted weight loss on Ozempic.  Lipids were well-controlled.  Chads Vascor estimated at 0-1 therefore no DOAC used.  Continued on diltiazem  XT 120 Miller daily with PRN propranolol.  Subjective  Discussed the use of AI scribe software for clinical note transcription with the patient, who gave verbal consent to proceed.  History of Present Illness Benjamin Paul is a 58 year old male who presents for an annual follow-up.  He takes propranolol approximately twelve times a year, often prophylactically in anticipation of potentially stressful events such as family visits. He has not experienced any prolonged episodes of palpitations.  His phone frequently indicates heart rate variations at night, but he does not wake up to confirm these readings with an EKG. No other cardiac symptoms aside from some right hand pain.  He maintains an active lifestyle, walking about four miles  daily.  Cardiovascular ROS: no chest pain or dyspnea on exertion positive for - irregular heartbeat, palpitations, and Apple Watch notifications of irregular Heart Rates - has taken Propranolol 12 times in last year - occasional prophylactic.  negative for - edema, loss of consciousness, orthopnea, paroxysmal nocturnal dyspnea, shortness of breath, or syncope/near syncope, TIA/Amaurosis Fugax/ CVA, claudication.  ROS:  Review of Systems - Negative except R hand pain    Objective    Studies Reviewed: SABRA   EKG Interpretation Date/Time:  Wednesday September 16 2024 08:53:18 EST Ventricular Rate:  54 PR Interval:  154 QRS Duration:  102 QT Interval:  416 QTC Calculation: 394 R Axis:   25  Text Interpretation: Sinus bradycardia When compared with ECG of 02-Oct-2023 08:15, Questionable change in QRS axis Confirmed by Benjamin Paul ([id number]) on 09/16/2024 9:01:00 AM    Due for labs to be checked by PCP soon.  ECHO: Normal LV size and function.  EF 60 to 65%.  No RWMA.  Normal diastolic parameters.  Normal valves.  (09/13/2021) Lexiscan Myoview 09/27/2021: Mild global hypokinesis EF of 45%.  INTERMEDIATE RISK due to no ischemia.:  (09/27/2021) Coronary Calcium  Score: CAC 0 11-day Zio patch: Heart rate range 39 to 130 bpm.  Average 64 bpm.  13 atrial tachycardia runs 4-20 beats.  Fastest was 4 beats at 197 bpm.  Longest was 20 beats at 104 bpm.  Rare PACs and PVCs.  1 run of 5 PVCs.  No A-fib or flutter.  Risk Assessment/Calculations:              Physical Exam:   VS:  BP 128/84 (BP Location: Left Arm, Patient Position: Sitting)   Pulse (!) 54   Ht 6' (1.829 m)   Wt 244 lb (110.7 kg)   SpO2 98%   BMI 33.09 kg/m    Wt Readings from Last 3 Encounters:  09/16/24 244 lb (110.7 kg)  10/02/23 226 lb (102.5 kg)  10/22/22 239 lb (108.4 kg)    GEN: Well nourished, well developed in no acute distress; healthy appearing NECK: No JVD; No carotid bruits CARDIAC: Normal S1, S2; RRR, no  murmurs, rubs, gallops RESPIRATORY:  Clear to auscultation without rales, wheezing or rhonchi ; nonlabored, good air movement. ABDOMEN: Soft, non-tender, non-distended EXTREMITIES:  No edema; No deformity      ASSESSMENT AND PLAN: .    Problem List Items Addressed This Visit       Cardiology Problems   Essential hypertension - Primary (Chronic)   Blood pressure well-controlled taking 10 mg lisinopril  along with the 120 mg diltiazem  CD  Echo.  No change.      Relevant Orders   EKG 12-Lead (Completed)   PAF (paroxysmal atrial fibrillation) (HCC) (Chronic)   Intermittent palpitations with nocturnal heart rate variations suggestive of atrial fibrillation.  Propranolol used prophylactically. No significant cardiac symptoms reported. - Ordered 30-day event monitor to compare heart rate variability with Apple Watch data.      Relevant Orders   EKG 12-Lead (Completed)   CARDIAC EVENT MONITOR   PAT (paroxysmal atrial tachycardia) (Chronic)   Need to delineate between PAF and PAT.  Apple watch suggesting possible A-fib, but he is only having these episodes while he is sleeping and therefore does not have rhythm strips.  - 30-day monitor in order to correlate Apple watch results with rhythm strips on monitor.      Relevant Orders   EKG 12-Lead (Completed)   CARDIAC EVENT MONITOR         Follow-Up: Return in about 1 year (around 09/16/2025) for Routine follow up with me, [employer].     Signed, Paul MICAEL Clay, MD, MS Paul Benjamin, M.D., M.S. Interventional Cardiologist  [facility] Pager # [phone]

## 2024-09-29 ENCOUNTER — Telehealth: Payer: Self-pay

## 2024-09-29 NOTE — Telephone Encounter (Signed)
   Cardiac Monitor Alert  Date of alert:  09/29/2024   Patient Name: Benjamin Paul  DOB: Mar 24, 1966  MRN: [id number]   [facility] Cardiologist: Alm Clay, MD  [facility] EP:  None    Monitor Information: Cardiac Event Monitor [[employer]]  Reason:  paroxysmal atrial tachycardia, paroxysmal atrial fibrillation  Ordering provider:  Dr. Alm Clay :1}  Alert Rhythm tracing is difficult to interpret, most likely a tachy arrhthymia.   Alert on 11/24 @ 6:46pm. This is the 1st alert for this rhythm.   Next Cardiology Appointment   Date:  1/16 @ 11:40am   Provider:  Dr. Alm Clay  The patient was contacted today.  He is asymptomatic. Arrhythmia, symptoms and history reviewed with Dr. Ren.   Plan:  Pt states that his monitor has been alerting him to course skin artifact and poor skin contact. Pt does not recall anything going on at 6:45pm last night. This is likely artifact from issues with good contact with monitor. Pt is in the process of changing out the monitor and sticker. Advised pt that if he continues to have issues to contact [employer] for additional support. Pt verbalizes understanding.    Brenen Beigel L, RN  09/29/2024 9:34 AM

## 2024-10-05 ENCOUNTER — Ambulatory Visit: Admitting: Family Medicine

## 2024-10-05 ENCOUNTER — Encounter: Payer: Self-pay | Admitting: Family Medicine

## 2024-10-05 VITALS — BP 124/80 | HR 58 | Ht 72.0 in | Wt 243.8 lb

## 2024-10-05 DIAGNOSIS — F411 Generalized anxiety disorder: Secondary | ICD-10-CM | POA: Insufficient documentation

## 2024-10-05 DIAGNOSIS — I479 Paroxysmal tachycardia, unspecified: Secondary | ICD-10-CM | POA: Insufficient documentation

## 2024-10-05 DIAGNOSIS — F418 Other specified anxiety disorders: Secondary | ICD-10-CM | POA: Insufficient documentation

## 2024-10-05 DIAGNOSIS — E66811 Obesity, class 1: Secondary | ICD-10-CM

## 2024-10-05 DIAGNOSIS — Z808 Family history of malignant neoplasm of other organs or systems: Secondary | ICD-10-CM | POA: Insufficient documentation

## 2024-10-05 DIAGNOSIS — R632 Polyphagia: Secondary | ICD-10-CM | POA: Insufficient documentation

## 2024-10-05 DIAGNOSIS — Z87898 Personal history of other specified conditions: Secondary | ICD-10-CM | POA: Insufficient documentation

## 2024-10-05 DIAGNOSIS — Z6833 Body mass index (BMI) 33.0-33.9, adult: Secondary | ICD-10-CM

## 2024-10-05 DIAGNOSIS — E65 Localized adiposity: Secondary | ICD-10-CM | POA: Insufficient documentation

## 2024-10-05 NOTE — Progress Notes (Signed)
 Assessment and Plan 1. Polyphagia   2. PT (paroxysmal tachycardia) (HCC)   3. Situational anxiety   4. GAD (generalized anxiety disorder)   5. Visceral obesity   6. Class 1 obesity with serious comorbidity and body mass index (BMI) of 33.0 to 33.9 in adult, unspecified obesity type    Assessment & Plan Paroxysmal tachyarrhythmia under cardiac monitoring Intermittent nocturnal tachyarrhythmia with palpitations. Cardiac monitoring showed rhythm tracing artifacts. Propranolol used as needed with occasional rebound tachycardia. - Continue cardiac monitoring until December 23rd. - Use propranolol as needed for palpitations. - Consider trial of Buspar for anxiety-related palpitations.  Obesity with weight gain Weight gain despite Wegovy 0.5 mg. Discussed increasing dosage and reducing caloric intake. Current exercise includes walking and weightlifting. Discussed Pilates for strength and anxiety management. - Consider increasing Wegovy dosage. - Encouraged continuation of exercise with emphasis on strength training and leg exercises. - Discussed benefits of Pilates for strength and anxiety management. - Consider obtaining a scale with body fat percentage measurement.  Geni Shutter, DO, MS, FAAFP, Dipl. [facility] [address] [place], [address] Dept: [phone] Dept Fax: [phone]  Subjective:   Patient is well-known to me from previous care setting and is establishing care in this system with me as PCP. Prior records reviewed when available. Chart updated today with reconciliation of problem list, medications, allergies, and relevant history. Preventive care and chronic disease status reviewed. Portions of historical chart may remain incomplete; will update on an ongoing basis as clinically indicated.  Discussed the use of AI scribe software for clinical note transcription with the patient, who gave verbal consent to  proceed. History of Present Illness Benjamin Paul is a 58 year old male with atrial fibrillation who presents for monitoring of cardiac symptoms.  Cardiac arrhythmia symptoms - Undergoing 30-day cardiac monitoring for episodes of atrial fibrillation, primarily occurring at night, approximately once per month during sleep - Finds cardiac monitoring device inconvenient due to poor phone connectivity - Experiences occasional palpitations when effects of propranolol, taken as needed, wear off - Previously experienced palpitations with sertraline, leading to discontinuation of the medication  Weight management and physical activity - Participating in a weight management program using [phone] - Has experienced some weight gain despite efforts - Adjusts caloric intake as part of weight management - Maintains an exercise routine of walking four to five miles daily and strength training  Musculoskeletal symptoms - Knee pain occured after using leg machines during strength training  Dermatological history - History of dermatofibroma, moles, seborrheic keratosis, and epidermal cysts - Family history of melanoma - Mole removed from chest in August  General health concerns - No recent laboratory work performed  Review of Systems: Negative, with the exception of above mentioned in HPI.  Current Outpatient Medications:    Cholecalciferol (HM VITAMIN D3) 100 MCG (4000 UT) CAPS, Take by mouth daily., Disp: , Rfl:    cyclobenzaprine (FLEXERIL) 5 MG tablet, Take 5 mg by mouth 3 (three) times daily as needed. , Disp: , Rfl:    diltiazem  (CARDIZEM  CD) 120 MG 24 hr capsule, TAKE 1 CAPSULE BY MOUTH EVERY DAY, Disp: 90 capsule, Rfl: 3   diltiazem  (CARDIZEM ) 30 MG tablet, Take 1 tablet (30 mg total) by mouth 3 (three) times daily as needed., Disp: 60 tablet, Rfl: 1   lisinopril  (ZESTRIL ) 10 MG tablet, TAKE 1 TABLET BY MOUTH EVERY DAY, Disp: 90 tablet, Rfl: 1   nitroGLYCERIN  (NITROSTAT ) 0.4 MG  SL tablet,  Place 1 tablet (0.4 mg total) under the tongue every 5 (five) minutes as needed for chest pain., Disp: 30 tablet, Rfl: 3   propranolol (INDERAL) 20 MG tablet, Take 20 mg by mouth as needed., Disp: , Rfl:    semaglutide-weight management (WEGOVY) 2.4 MG/0.75ML SOAJ SQ injection, Inject 2.4 mg into the skin once a week., Disp: , Rfl:    valACYclovir (VALTREX) 1000 MG tablet, Take 1 tablet by mouth daily., Disp: , Rfl:    Objective:   BP 124/80 (BP Location: Right Arm, Cuff Size: Normal)   Pulse (!) 58   Ht 6' (1.829 m)   Wt 243 lb 12.8 oz (110.6 kg)   SpO2 93%   BMI 33.07 kg/m   Physical Exam Constitutional:      General: He is not in acute distress.    Appearance: He is well-developed. He is obese.  HENT:     Head: Normocephalic and atraumatic.  Eyes:     Conjunctiva/sclera: Conjunctivae normal.  Cardiovascular:     Rate and Rhythm: Normal rate and regular rhythm.     Heart sounds: Normal heart sounds.  Pulmonary:     Effort: Pulmonary effort is normal.     Breath sounds: Normal breath sounds.  Musculoskeletal:     Cervical back: Normal range of motion and neck supple.  Neurological:     General: No focal deficit present.     Mental Status: He is alert and oriented to person, place, and time.  Psychiatric:        Mood and Affect: Mood normal.        Behavior: Behavior normal.   Recent labs with [facility]. Will abstract them into the chart.

## 2024-10-09 ENCOUNTER — Ambulatory Visit: Admitting: Family Medicine

## 2024-11-03 ENCOUNTER — Ambulatory Visit: Admitting: Family Medicine

## 2024-11-03 ENCOUNTER — Ambulatory Visit: Attending: Cardiology

## 2024-11-03 DIAGNOSIS — I480 Paroxysmal atrial fibrillation: Secondary | ICD-10-CM

## 2024-11-03 DIAGNOSIS — I4719 Other supraventricular tachycardia: Secondary | ICD-10-CM

## 2024-11-09 ENCOUNTER — Ambulatory Visit: Admitting: Cardiology

## 2024-11-13 DIAGNOSIS — I4719 Other supraventricular tachycardia: Secondary | ICD-10-CM | POA: Diagnosis not present

## 2024-11-13 DIAGNOSIS — I480 Paroxysmal atrial fibrillation: Secondary | ICD-10-CM | POA: Diagnosis not present

## 2024-11-15 ENCOUNTER — Ambulatory Visit: Payer: Self-pay | Admitting: Cardiology

## 2024-11-19 NOTE — Telephone Encounter (Signed)
 Called patient and offered an office visit instead of  telephone visit on 11/20/24  Patient was in agreement  he will come at 11:40 AM

## 2024-11-20 ENCOUNTER — Encounter: Payer: Self-pay | Admitting: Cardiology

## 2024-11-20 ENCOUNTER — Ambulatory Visit: Attending: Cardiology | Admitting: Cardiology

## 2024-11-20 VITALS — BP 138/82 | HR 66 | Ht 72.0 in | Wt 248.2 lb

## 2024-11-20 DIAGNOSIS — I10 Essential (primary) hypertension: Secondary | ICD-10-CM

## 2024-11-20 DIAGNOSIS — E661 Drug-induced obesity: Secondary | ICD-10-CM | POA: Diagnosis not present

## 2024-11-20 DIAGNOSIS — E66811 Obesity, class 1: Secondary | ICD-10-CM

## 2024-11-20 DIAGNOSIS — E782 Mixed hyperlipidemia: Secondary | ICD-10-CM

## 2024-11-20 DIAGNOSIS — Z6830 Body mass index (BMI) 30.0-30.9, adult: Secondary | ICD-10-CM

## 2024-11-20 DIAGNOSIS — I209 Angina pectoris, unspecified: Secondary | ICD-10-CM | POA: Diagnosis not present

## 2024-11-20 DIAGNOSIS — I480 Paroxysmal atrial fibrillation: Secondary | ICD-10-CM

## 2024-11-20 DIAGNOSIS — I4719 Other supraventricular tachycardia: Secondary | ICD-10-CM

## 2024-11-20 NOTE — Progress Notes (Signed)
 " Cardiology Office Note:  .   Date:  11/20/2024  ID:  Benjamin Paul, DOB 1966-02-07, MRN [id number] PCP: Prentiss Frieze, DO  [facility] Providers Cardiologist:  Alm Clay, MD     Chief Complaint  Patient presents with   Follow-up   Atrial Fibrillation    Confirmation of diagnosis with 30-day monitor.   Hypertension    Well-controlled pressures    Patient Profile: .     Benjamin Paul is a  59 y.o. male  with a PMH notable for HTN, HLD, PAT & confirmed Dx of PAF who presents here for Close F/u to discuss Test Results.    Initially referred at the request of Cleotilde Planas, MD.     Benjamin Paul was last seen on 09/16/2024: noted taking PRN Propranolol for breakthrough rapid HR ~ 12 x in last year, usually prophylactic prior to stressful situations (or when Apple Watch warns him)  Subjective  Discussed the use of AI scribe software for clinical note transcription with the patient, who gave verbal consent to proceed.  History of Present Illness Benjamin Paul is a 59 year old male with atrial fibrillation who presents for follow-up regarding his heart rhythm monitoring results.  He has been experiencing episodes of atrial fibrillation and has been monitored to assess the frequency and duration of these episodes. He takes propranolol approximately twelve times a year, with the most recent episode occurring around Thanksgiving while wearing a heart monitor. The monitor recorded several episodes of atrial fibrillation, with the longest lasting over five hours on December 13th. He was likely asleep during this episode and did not notice it. Most episodes occur without his awareness, and he only occasionally feels his heart beating fast.  He is currently on diltiazem  long-acting and lisinopril  10 mg. He uses propranolol as needed when he anticipates stressful situations or when he feels his heart rate increase. Propranolol works quickly to manage his symptoms.  He maintains  an active lifestyle, walking five miles in the morning, and has a history of hypertension. He is not diabetic and is using Ozempic for weight loss. Recent blood tests indicated low calcium  levels, but other parameters were normal.  Cardiovascular ROS: no chest pain or dyspnea on exertion positive for - irregular heartbeat, palpitations, and although he feels these, he did not notice many of the episodes noted on the monitor with A-fib. negative for - edema, orthopnea, paroxysmal nocturnal dyspnea, shortness of breath, or lightheadedness, dizziness or wooziness, syncope or near syncope, TIA or amaurosis fugax.  Melena, hematochezia, hematuria epistaxis. Overall doing well.  No complaints.     Objective   Medications: Diltiazem  120 mg daily, lisinopril  10 mg daily; PRN Inderal 20 mg for anxiety/stress or palpitations; converted from [facility] to Ozempic (for weight loss)  Studies Reviewed: SABRA   EKG Interpretation Date/Time:  Friday November 20 2024 11:35:30 EST Ventricular Rate:  66 PR Interval:  160 QRS Duration:  96 QT Interval:  400 QTC Calculation: 419 R Axis:   48  Text Interpretation: Normal sinus rhythm Cannot rule out Anterior infarct , age undetermined When compared with ECG of 16-Sep-2024 08:53, No significant change was found Confirmed by Clay Alm ([id number]) on 11/20/2024 11:45:25 AM    30-Day Mobile Cardiac Telemetry Monitor   Predominantly sinus rhythm (95%) with 5% Atrial Fibrillation/Atrial Flutter burden.  Overall heart rate range was 45 to 163 bpm with an average of 70 bpm.  Sinus rhythm was mostly controlled (72%) or tachyardic (  26%).  26% (8 hours 9 minutes) of A-fib (/atrial flutter) was rapid with 74% being controlled (23 hours 33 minutes)   1 brief episode of nonsustained ventricular tachycardia-4 PVCs at a rate of 146 bpm lasting 2 seconds.   2 episodes of nonsustained Supraventricular/(Atrial) Tachycardia: Longest was 8 minutes, fastest was 170 bpm   Rare Premature  Atrial and Ventricular Contractions (PACs and PVCs).   Longest episode of A-fib was 5 hours and 1 minute fastest was 160 bpm   This does confirm presence of atrial fibrillation more so than the tachycardia.  Prior CV Studies ECHO: Normal LV size and function.  EF 60 to 65%.  No RWMA.  Normal diastolic parameters.  Normal valves.  (09/13/2021) Lexiscan Myoview 09/27/2021: Mild global hypokinesis EF of 45%.  INTERMEDIATE RISK due to no ischemia.:  (09/27/2021) Coronary Calcium  Score: CAC 0 11-day Zio patch: Heart rate range 39 to 130 bpm.  Average 64 bpm.  13 atrial tachycardia runs 4-20 beats.  Fastest was 4 beats at 197 bpm.  Longest was 20 beats at 104 bpm.  Rare PACs and PVCs.  1 run of 5 PVCs.  No A-fib or flutter.  Risk Assessment/Calculations:    CHA2DS2-VASc Score =    This patients CHA2DS2-VASc Score and unadjusted Ischemic Stroke Rate (% per year) is equal to 0.6 % stroke rate/year from a score of 1  Above score calculated as 1 point each if present [CHF, HTN, DM, Vascular=MI/PAD/Aortic Plaque, Age if 8-74, or Male] Above score calculated as 2 points each if present [Age > 75, or Stroke/TIA/TE]         Physical Exam:   VS:  BP 138/82   Pulse 66   Ht 6' (1.829 m)   Wt 248 lb 3.2 oz (112.6 kg)   SpO2 99%   BMI 33.66 kg/m    Wt Readings from Last 3 Encounters:  11/20/24 248 lb 3.2 oz (112.6 kg)  10/05/24 243 lb 12.8 oz (110.6 kg)  09/16/24 244 lb (110.7 kg)      GEN: Well nourished, well developed in no acute distress; although he has BMI of 33.6 he is not obese. NECK: No JVD; No carotid bruits CARDIAC: RRR, Normal S1, S2; no murmurs, rubs, gallops RESPIRATORY:  Clear to auscultation without rales, wheezing or rhonchi ; nonlabored, good air movement. ABDOMEN: Soft, non-tender, non-distended EXTREMITIES:  No edema; No deformity      ASSESSMENT AND PLAN: .   PAF (paroxysmal atrial fibrillation) (HCC) Previously this was a presumed diagnosis but now we have  confirmation. A-fib burden of 5% with episodes lasting up to 5 hours but sometimes just simply 20 minutes.  Overall 5% burden, 72% rate controlled, 26% rapid.  No significant symptoms, often during sleep. CHADS-VASc score 1, low stroke risk.  Current management effective. Discussed low stroke prevention risk with aspirin. Consider antiarrhythmic or ablation if episodes increase. = Prolonged conversation about atrial fibrillation pathophysiology, treatment options for now and going forward.  Recommendations about anticoagulation or not an antiarrhythmics. - Continue diltiazem  CD1 20 mg daily, and as-needed propranolol 20 mg daily. - Consider baby aspirin (81 mg) for stroke prevention if desired. - Monitor for increased frequency or duration of episodes. - We discussed antiarrhythmic agents or ablation if episodes increase.  PAT (paroxysmal atrial tachycardia) This time around his PAT episodes were present but still very short with the longest being 8 beats.  I suspect if these are just precursors to his A-fib and were less frequent then his  A-fib burden. . Current management effective in controlling heart rate. - Continue current management with diltiazem  and propranolol.  Essential hypertension Managed with lisinopril  10 mg in addition to diltiazem  CD 120 mg daily.  Blood pressure control crucial for cardiovascular health and stroke risk reduction in atrial fibrillation context. - Continue lisinopril  and diltiazem  with occasional as needed propranolol 20 mg for fast heart rates but can also use for high blood pressure   Orders Placed This Encounter  Procedures   EKG 12-Lead           Follow-Up: Return in about 1 year (around 11/20/2025) for Routine follow up with me, [employer].  I spent 48 minutes in the care of Benjamin Paul today including reviewing studies (5 minutes reviewing Zio patch monitor prior to visit), face to face time discussing treatment options (35  minutes), reviewing records from previous visits and results (7 minutes), 12 minutes dictating, and documenting in the encounter.      Signed, Alm MICAEL Clay, MD, MS Alm Clay, M.D., M.S. Interventional Cardiologist  [facility] Pager # [phone]      "

## 2024-11-20 NOTE — Assessment & Plan Note (Signed)
 Previously this was a presumed diagnosis but now we have confirmation. A-fib burden of 5% with episodes lasting up to 5 hours but sometimes just simply 20 minutes.  Overall 5% burden, 72% rate controlled, 26% rapid.  No significant symptoms, often during sleep. CHADS-VASc score 1, low stroke risk.  Current management effective. Discussed low stroke prevention risk with aspirin. Consider antiarrhythmic or ablation if episodes increase. = Prolonged conversation about atrial fibrillation pathophysiology, treatment options for now and going forward.  Recommendations about anticoagulation or not an antiarrhythmics. - Continue diltiazem  CD1 20 mg daily, and as-needed propranolol 20 mg daily. - Consider baby aspirin (81 mg) for stroke prevention if desired. - Monitor for increased frequency or duration of episodes. - We discussed antiarrhythmic agents or ablation if episodes increase.

## 2024-11-20 NOTE — Assessment & Plan Note (Signed)
 This time around his PAT episodes were present but still very short with the longest being 8 beats.  I suspect if these are just precursors to his A-fib and were less frequent then his A-fib burden. . Current management effective in controlling heart rate. - Continue current management with diltiazem  and propranolol.

## 2024-11-20 NOTE — Patient Instructions (Addendum)

## 2024-11-20 NOTE — Assessment & Plan Note (Signed)
 Managed with lisinopril  10 mg in addition to diltiazem  CD 120 mg daily.  Blood pressure control crucial for cardiovascular health and stroke risk reduction in atrial fibrillation context. - Continue lisinopril  and diltiazem  with occasional as needed propranolol 20 mg for fast heart rates but can also use for high blood pressure

## 2024-11-25 ENCOUNTER — Other Ambulatory Visit: Payer: Self-pay | Admitting: Cardiology

## 2024-11-25 DIAGNOSIS — I10 Essential (primary) hypertension: Secondary | ICD-10-CM

## 2024-11-25 DIAGNOSIS — I480 Paroxysmal atrial fibrillation: Secondary | ICD-10-CM
